# Patient Record
Sex: Female | Born: 1950
Health system: Southern US, Community
[De-identification: ages and names within clinical notes are randomized; demographics above are authoritative.]

---

## 1998-07-09 ENCOUNTER — Other Ambulatory Visit: Admission: RE | Admit: 1998-07-09 | Discharge: 1998-07-09 | Payer: Self-pay | Admitting: Obstetrics & Gynecology

## 1998-09-02 ENCOUNTER — Other Ambulatory Visit: Admission: RE | Admit: 1998-09-02 | Discharge: 1998-09-02 | Payer: Self-pay | Admitting: Obstetrics & Gynecology

## 1998-09-17 ENCOUNTER — Inpatient Hospital Stay (HOSPITAL_COMMUNITY): Admission: AD | Admit: 1998-09-17 | Discharge: 1998-09-17 | Payer: Self-pay | Admitting: *Deleted

## 1998-10-05 ENCOUNTER — Ambulatory Visit (HOSPITAL_COMMUNITY): Admission: RE | Admit: 1998-10-05 | Discharge: 1998-10-05 | Payer: Self-pay | Admitting: Obstetrics & Gynecology

## 1999-03-12 ENCOUNTER — Other Ambulatory Visit: Admission: RE | Admit: 1999-03-12 | Discharge: 1999-03-12 | Payer: Self-pay | Admitting: Obstetrics & Gynecology

## 1999-03-29 ENCOUNTER — Ambulatory Visit (HOSPITAL_COMMUNITY): Admission: RE | Admit: 1999-03-29 | Discharge: 1999-03-29 | Payer: Self-pay | Admitting: Interventional Radiology

## 1999-04-01 ENCOUNTER — Encounter: Payer: Self-pay | Admitting: Obstetrics & Gynecology

## 1999-06-24 ENCOUNTER — Ambulatory Visit (HOSPITAL_COMMUNITY): Admission: RE | Admit: 1999-06-24 | Discharge: 1999-06-24 | Payer: Self-pay | Admitting: Interventional Radiology

## 1999-07-05 ENCOUNTER — Encounter: Payer: Self-pay | Admitting: Interventional Radiology

## 1999-07-05 ENCOUNTER — Observation Stay (HOSPITAL_COMMUNITY): Admission: RE | Admit: 1999-07-05 | Discharge: 1999-07-06 | Payer: Self-pay | Admitting: Interventional Radiology

## 1999-07-11 ENCOUNTER — Other Ambulatory Visit: Admission: RE | Admit: 1999-07-11 | Discharge: 1999-07-11 | Payer: Self-pay | Admitting: *Deleted

## 1999-10-17 ENCOUNTER — Encounter: Payer: Self-pay | Admitting: Obstetrics & Gynecology

## 1999-10-17 ENCOUNTER — Ambulatory Visit (HOSPITAL_COMMUNITY): Admission: RE | Admit: 1999-10-17 | Discharge: 1999-10-17 | Payer: Self-pay | Admitting: Obstetrics & Gynecology

## 1999-12-21 ENCOUNTER — Ambulatory Visit (HOSPITAL_COMMUNITY): Admission: RE | Admit: 1999-12-21 | Discharge: 1999-12-21 | Payer: Self-pay | Admitting: Interventional Radiology

## 1999-12-22 ENCOUNTER — Ambulatory Visit (HOSPITAL_COMMUNITY): Admission: RE | Admit: 1999-12-22 | Discharge: 1999-12-22 | Payer: Self-pay | Admitting: Interventional Radiology

## 2000-02-06 ENCOUNTER — Other Ambulatory Visit: Admission: RE | Admit: 2000-02-06 | Discharge: 2000-02-06 | Payer: Self-pay | Admitting: Obstetrics & Gynecology

## 2000-02-06 ENCOUNTER — Encounter (INDEPENDENT_AMBULATORY_CARE_PROVIDER_SITE_OTHER): Payer: Self-pay

## 2000-06-06 ENCOUNTER — Encounter: Admission: RE | Admit: 2000-06-06 | Discharge: 2000-06-06 | Payer: Self-pay | Admitting: Family Medicine

## 2000-06-06 ENCOUNTER — Encounter: Payer: Self-pay | Admitting: Family Medicine

## 2000-10-01 ENCOUNTER — Encounter: Payer: Self-pay | Admitting: Neurology

## 2000-10-01 ENCOUNTER — Ambulatory Visit (HOSPITAL_COMMUNITY): Admission: RE | Admit: 2000-10-01 | Discharge: 2000-10-01 | Payer: Self-pay | Admitting: Neurology

## 2001-06-07 ENCOUNTER — Encounter: Admission: RE | Admit: 2001-06-07 | Discharge: 2001-06-07 | Payer: Self-pay | Admitting: Family Medicine

## 2001-06-07 ENCOUNTER — Encounter: Payer: Self-pay | Admitting: Family Medicine

## 2001-11-09 ENCOUNTER — Ambulatory Visit (HOSPITAL_COMMUNITY): Admission: RE | Admit: 2001-11-09 | Discharge: 2001-11-09 | Payer: Self-pay | Admitting: Family Medicine

## 2001-11-09 ENCOUNTER — Encounter: Payer: Self-pay | Admitting: Family Medicine

## 2002-05-26 ENCOUNTER — Encounter: Payer: Self-pay | Admitting: Neurosurgery

## 2002-05-26 ENCOUNTER — Ambulatory Visit (HOSPITAL_COMMUNITY): Admission: RE | Admit: 2002-05-26 | Discharge: 2002-05-26 | Payer: Self-pay | Admitting: Neurosurgery

## 2002-06-09 ENCOUNTER — Encounter: Admission: RE | Admit: 2002-06-09 | Discharge: 2002-06-09 | Payer: Self-pay | Admitting: Family Medicine

## 2002-06-09 ENCOUNTER — Encounter: Payer: Self-pay | Admitting: Family Medicine

## 2002-09-04 ENCOUNTER — Other Ambulatory Visit: Admission: RE | Admit: 2002-09-04 | Discharge: 2002-09-04 | Payer: Self-pay | Admitting: Family Medicine

## 2003-06-11 ENCOUNTER — Encounter: Admission: RE | Admit: 2003-06-11 | Discharge: 2003-06-11 | Payer: Self-pay | Admitting: Family Medicine

## 2003-06-11 ENCOUNTER — Encounter: Payer: Self-pay | Admitting: Family Medicine

## 2004-06-13 ENCOUNTER — Encounter: Admission: RE | Admit: 2004-06-13 | Discharge: 2004-06-13 | Payer: Self-pay | Admitting: Family Medicine

## 2004-10-12 ENCOUNTER — Other Ambulatory Visit: Admission: RE | Admit: 2004-10-12 | Discharge: 2004-10-12 | Payer: Self-pay | Admitting: Family Medicine

## 2005-06-15 ENCOUNTER — Encounter: Admission: RE | Admit: 2005-06-15 | Discharge: 2005-06-15 | Payer: Self-pay | Admitting: Family Medicine

## 2006-06-18 ENCOUNTER — Encounter: Admission: RE | Admit: 2006-06-18 | Discharge: 2006-06-18 | Payer: Self-pay | Admitting: Family Medicine

## 2006-10-18 ENCOUNTER — Other Ambulatory Visit: Admission: RE | Admit: 2006-10-18 | Discharge: 2006-10-18 | Payer: Self-pay | Admitting: Family Medicine

## 2007-06-20 ENCOUNTER — Encounter: Admission: RE | Admit: 2007-06-20 | Discharge: 2007-06-20 | Payer: Self-pay | Admitting: Family Medicine

## 2007-10-24 ENCOUNTER — Other Ambulatory Visit: Admission: RE | Admit: 2007-10-24 | Discharge: 2007-10-24 | Payer: Self-pay | Admitting: Family Medicine

## 2008-06-22 ENCOUNTER — Encounter: Admission: RE | Admit: 2008-06-22 | Discharge: 2008-06-22 | Payer: Self-pay | Admitting: Family Medicine

## 2009-06-23 ENCOUNTER — Encounter: Admission: RE | Admit: 2009-06-23 | Discharge: 2009-06-23 | Payer: Self-pay | Admitting: Family Medicine

## 2009-11-08 ENCOUNTER — Other Ambulatory Visit: Admission: RE | Admit: 2009-11-08 | Discharge: 2009-11-08 | Payer: Self-pay | Admitting: Family Medicine

## 2010-07-01 ENCOUNTER — Encounter: Admission: RE | Admit: 2010-07-01 | Discharge: 2010-07-01 | Payer: Self-pay | Admitting: Family Medicine

## 2010-11-10 ENCOUNTER — Other Ambulatory Visit: Admission: RE | Admit: 2010-11-10 | Discharge: 2010-11-10 | Payer: Self-pay | Admitting: Family Medicine

## 2011-02-16 ENCOUNTER — Other Ambulatory Visit: Payer: Self-pay | Admitting: Family Medicine

## 2011-02-16 DIAGNOSIS — Z1231 Encounter for screening mammogram for malignant neoplasm of breast: Secondary | ICD-10-CM

## 2011-07-04 ENCOUNTER — Ambulatory Visit
Admission: RE | Admit: 2011-07-04 | Discharge: 2011-07-04 | Disposition: A | Discharge status: Home / Self Care | Payer: 59 | Source: Ambulatory Visit | Attending: Family Medicine | Admitting: Family Medicine

## 2011-07-04 DIAGNOSIS — Z1231 Encounter for screening mammogram for malignant neoplasm of breast: Secondary | ICD-10-CM

## 2012-03-14 ENCOUNTER — Other Ambulatory Visit: Payer: Self-pay | Admitting: Family Medicine

## 2012-03-14 DIAGNOSIS — Z1231 Encounter for screening mammogram for malignant neoplasm of breast: Secondary | ICD-10-CM

## 2012-07-05 ENCOUNTER — Ambulatory Visit
Admission: RE | Admit: 2012-07-05 | Discharge: 2012-07-05 | Disposition: A | Discharge status: Home / Self Care | Payer: BC Managed Care – PPO | Source: Ambulatory Visit | Attending: Family Medicine | Admitting: Family Medicine

## 2012-07-05 DIAGNOSIS — Z1231 Encounter for screening mammogram for malignant neoplasm of breast: Secondary | ICD-10-CM

## 2013-02-21 ENCOUNTER — Other Ambulatory Visit: Payer: Self-pay

## 2013-02-21 DIAGNOSIS — Z1231 Encounter for screening mammogram for malignant neoplasm of breast: Secondary | ICD-10-CM

## 2013-07-08 ENCOUNTER — Ambulatory Visit
Admission: RE | Admit: 2013-07-08 | Discharge: 2013-07-08 | Disposition: A | Discharge status: Home / Self Care | Payer: BC Managed Care – PPO | Source: Ambulatory Visit

## 2013-07-08 DIAGNOSIS — Z1231 Encounter for screening mammogram for malignant neoplasm of breast: Secondary | ICD-10-CM

## 2013-08-11 ENCOUNTER — Other Ambulatory Visit: Payer: Self-pay | Admitting: Family Medicine

## 2013-08-11 ENCOUNTER — Ambulatory Visit
Admission: RE | Admit: 2013-08-11 | Discharge: 2013-08-11 | Disposition: A | Discharge status: Home / Self Care | Payer: 59 | Source: Ambulatory Visit | Attending: Family Medicine | Admitting: Family Medicine

## 2013-08-11 ENCOUNTER — Ambulatory Visit
Admission: RE | Admit: 2013-08-11 | Discharge: 2013-08-11 | Disposition: A | Discharge status: Home / Self Care | Payer: BC Managed Care – PPO | Source: Ambulatory Visit | Attending: Family Medicine | Admitting: Family Medicine

## 2013-08-11 DIAGNOSIS — R609 Edema, unspecified: Secondary | ICD-10-CM

## 2013-08-11 DIAGNOSIS — R52 Pain, unspecified: Secondary | ICD-10-CM

## 2013-08-14 ENCOUNTER — Other Ambulatory Visit (HOSPITAL_COMMUNITY): Payer: Self-pay | Admitting: Family Medicine

## 2013-08-14 DIAGNOSIS — M79641 Pain in right hand: Secondary | ICD-10-CM

## 2013-08-19 ENCOUNTER — Encounter (HOSPITAL_COMMUNITY)
Admission: RE | Admit: 2013-08-19 | Discharge: 2013-08-19 | Disposition: A | Discharge status: Home / Self Care | Payer: 59 | Source: Ambulatory Visit | Attending: Family Medicine | Admitting: Family Medicine

## 2013-08-19 DIAGNOSIS — M79641 Pain in right hand: Secondary | ICD-10-CM

## 2013-08-19 DIAGNOSIS — M79609 Pain in unspecified limb: Secondary | ICD-10-CM | POA: Insufficient documentation

## 2013-08-19 MED ORDER — TECHNETIUM TC 99M MEDRONATE IV KIT
25.0000 | PACK | Freq: Once | INTRAVENOUS | Status: AC | PRN
  Administered 2013-08-19: 25 via INTRAVENOUS

## 2013-12-03 ENCOUNTER — Other Ambulatory Visit: Payer: Self-pay | Admitting: Family Medicine

## 2013-12-03 ENCOUNTER — Other Ambulatory Visit (HOSPITAL_COMMUNITY)
Admission: RE | Admit: 2013-12-03 | Discharge: 2013-12-03 | Disposition: A | Discharge status: Home / Self Care | Payer: 59 | Source: Ambulatory Visit | Attending: Family Medicine | Admitting: Family Medicine

## 2013-12-03 DIAGNOSIS — Z124 Encounter for screening for malignant neoplasm of cervix: Secondary | ICD-10-CM | POA: Insufficient documentation

## 2014-03-16 ENCOUNTER — Other Ambulatory Visit: Payer: Self-pay

## 2014-03-16 DIAGNOSIS — Z1231 Encounter for screening mammogram for malignant neoplasm of breast: Secondary | ICD-10-CM

## 2014-07-10 ENCOUNTER — Ambulatory Visit
Admission: RE | Admit: 2014-07-10 | Discharge: 2014-07-10 | Disposition: A | Discharge status: Home / Self Care | Payer: 59 | Source: Ambulatory Visit

## 2014-07-10 DIAGNOSIS — Z1231 Encounter for screening mammogram for malignant neoplasm of breast: Secondary | ICD-10-CM

## 2015-02-10 IMAGING — CR DG FINGER LITTLE 2+V*R*
3 series · 3 of 3 positions shown · non-contrast
Comparison: None.

CLINICAL DATA: Pain and swelling [DATE] half weeks involving the
right fifth distal interphalangeal joint

RIGHT LITTLE FINGER 2+V

[view not recorded (1 of 3)]
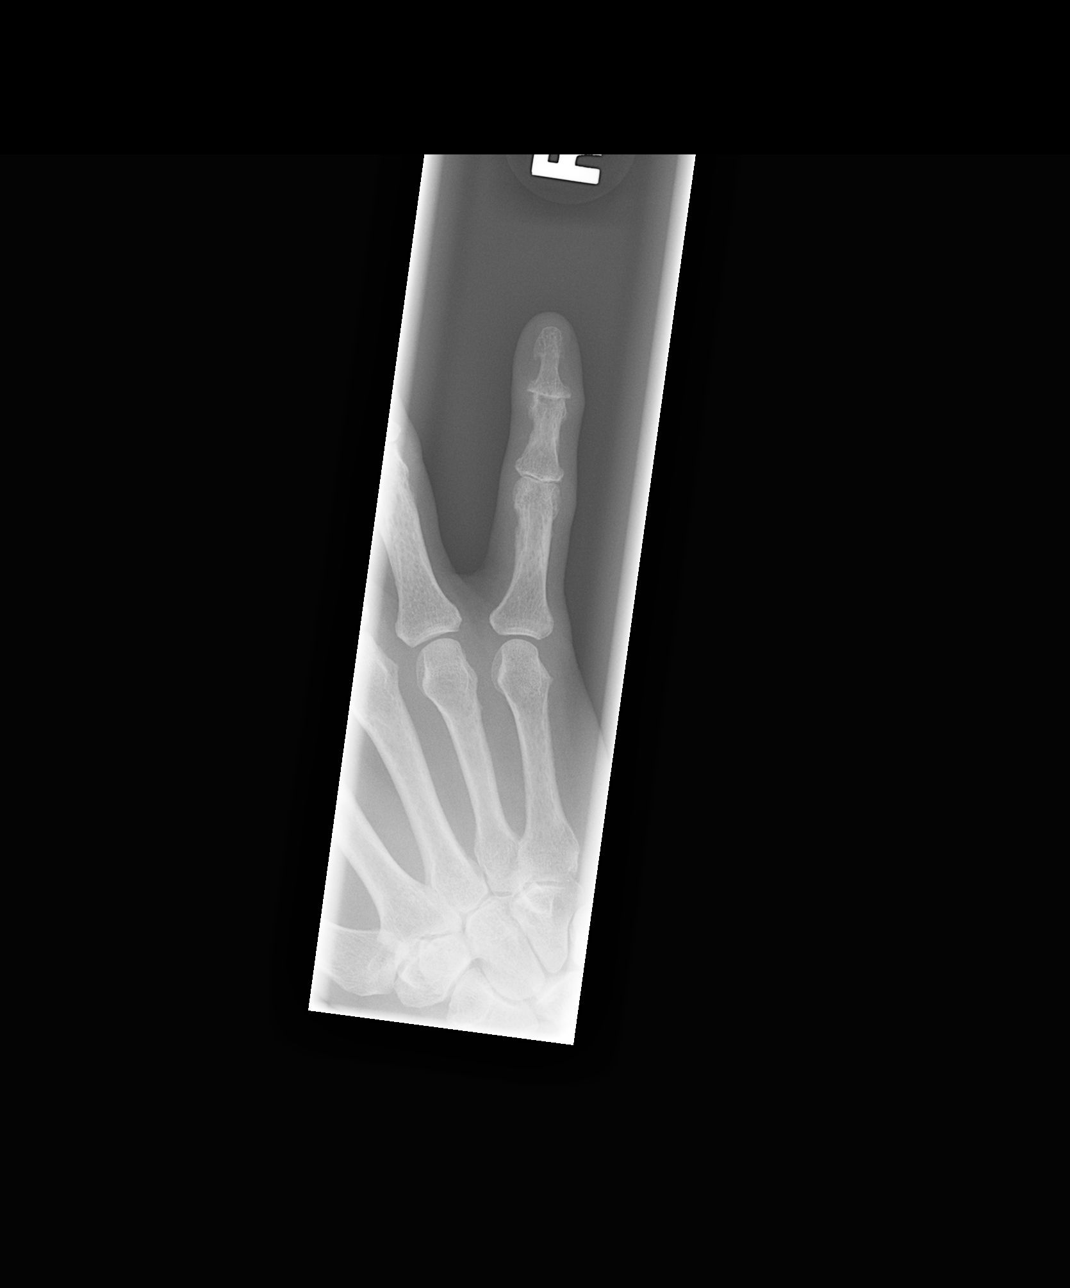

[view not recorded (2 of 3)]
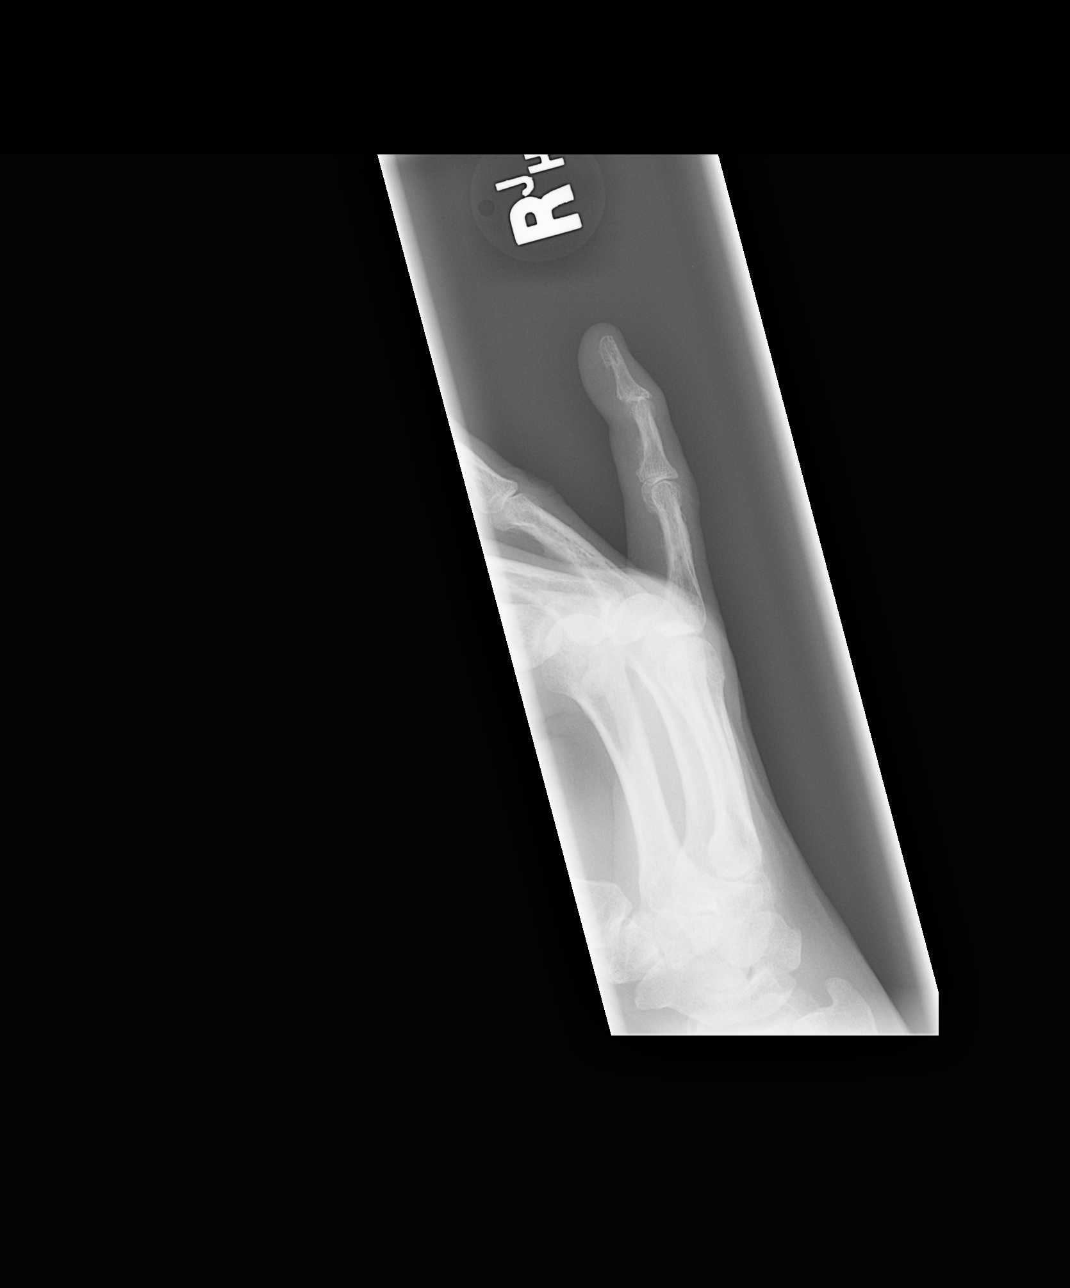

[view not recorded (3 of 3)]
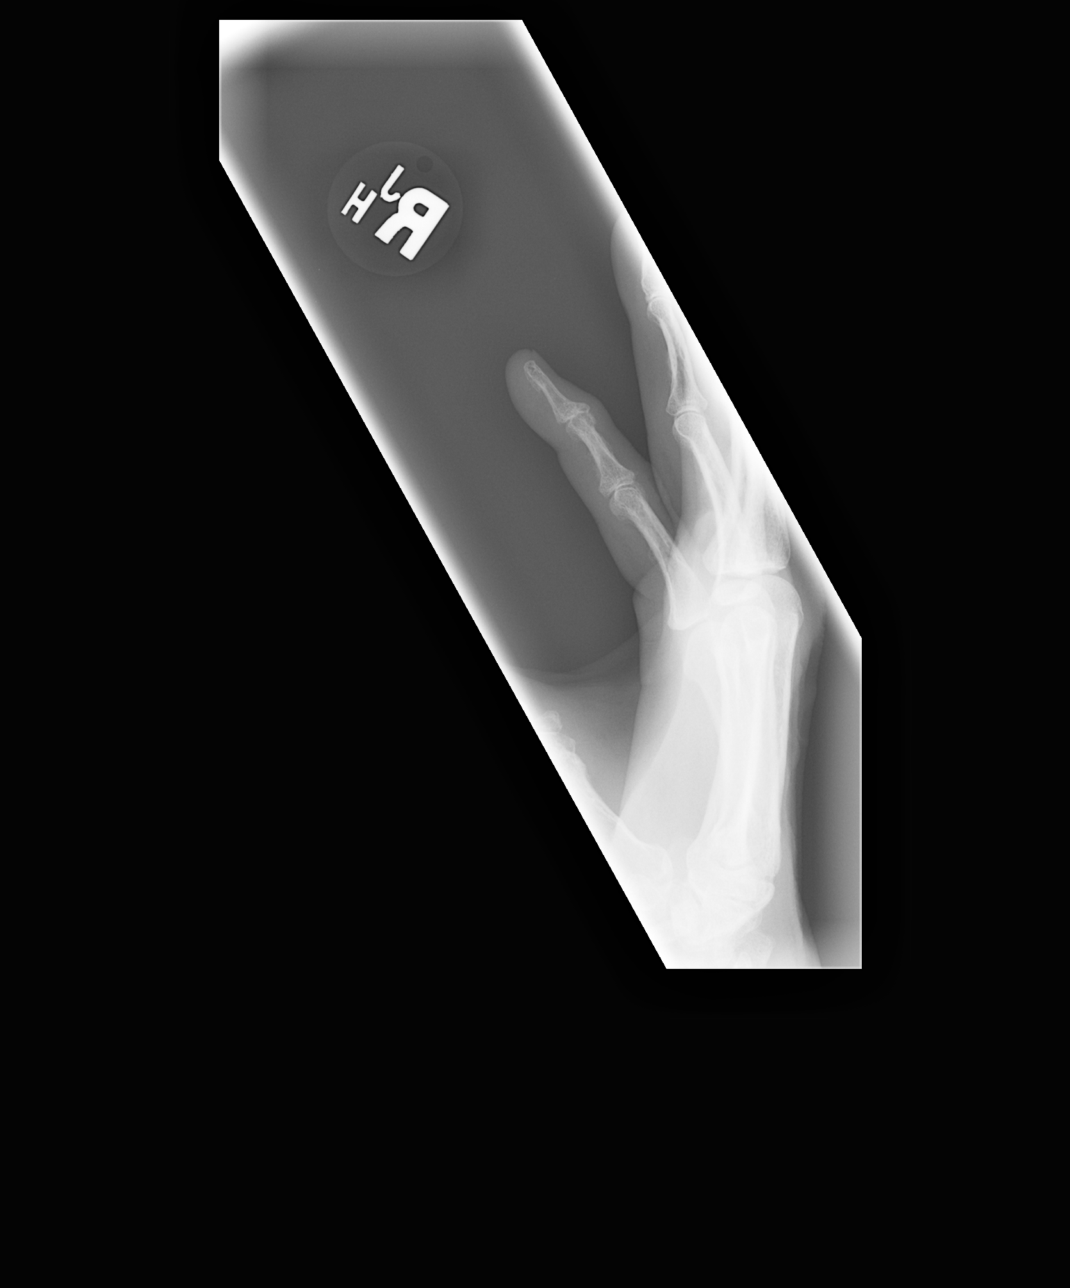

[3 of 3 positions shown; findings below may reference images not displayed]

FINDINGS: There is narrowing of the distal interphalangeal joint.
There is mild soft tissue swelling.  There is minimal osteophyte
formation.  There is some evidence of subcortical lucency involving
the distal aspect of the middle phalanx and the cortex of the
distal aspect of the middle phalanx it is difficult to visualize.
IMPRESSION: Distal interphalangeal joint is abnormal with associated soft
tissue swelling and significant joint space loss.  This could be
due to arthritis and could represent a Heberdens Node, alhtough
there is relatively little osteophyte formation.  If there is any
reason to suspect infection, a 3-phase bone scan would be
suggested.

## 2015-02-26 ENCOUNTER — Other Ambulatory Visit: Payer: Self-pay

## 2015-02-26 DIAGNOSIS — Z1231 Encounter for screening mammogram for malignant neoplasm of breast: Secondary | ICD-10-CM

## 2015-07-12 ENCOUNTER — Ambulatory Visit
Admission: RE | Admit: 2015-07-12 | Discharge: 2015-07-12 | Disposition: A | Discharge status: Home / Self Care | Payer: PPO | Source: Ambulatory Visit

## 2015-07-12 DIAGNOSIS — Z1231 Encounter for screening mammogram for malignant neoplasm of breast: Secondary | ICD-10-CM

## 2016-01-03 DIAGNOSIS — A35 Other tetanus: Secondary | ICD-10-CM | POA: Diagnosis not present

## 2016-01-03 DIAGNOSIS — M1990 Unspecified osteoarthritis, unspecified site: Secondary | ICD-10-CM | POA: Diagnosis not present

## 2016-01-03 DIAGNOSIS — E538 Deficiency of other specified B group vitamins: Secondary | ICD-10-CM | POA: Diagnosis not present

## 2016-01-03 DIAGNOSIS — I10 Essential (primary) hypertension: Secondary | ICD-10-CM | POA: Diagnosis not present

## 2016-01-03 DIAGNOSIS — F419 Anxiety disorder, unspecified: Secondary | ICD-10-CM | POA: Diagnosis not present

## 2016-01-03 DIAGNOSIS — Z0000 Encounter for general adult medical examination without abnormal findings: Secondary | ICD-10-CM | POA: Diagnosis not present

## 2016-01-03 DIAGNOSIS — G609 Hereditary and idiopathic neuropathy, unspecified: Secondary | ICD-10-CM | POA: Diagnosis not present

## 2016-04-11 DIAGNOSIS — H524 Presbyopia: Secondary | ICD-10-CM | POA: Diagnosis not present

## 2016-04-11 DIAGNOSIS — H2513 Age-related nuclear cataract, bilateral: Secondary | ICD-10-CM | POA: Diagnosis not present

## 2016-04-11 DIAGNOSIS — H5212 Myopia, left eye: Secondary | ICD-10-CM | POA: Diagnosis not present

## 2016-04-11 DIAGNOSIS — H04123 Dry eye syndrome of bilateral lacrimal glands: Secondary | ICD-10-CM | POA: Diagnosis not present

## 2016-04-11 DIAGNOSIS — H353131 Nonexudative age-related macular degeneration, bilateral, early dry stage: Secondary | ICD-10-CM | POA: Diagnosis not present

## 2016-04-11 DIAGNOSIS — H5201 Hypermetropia, right eye: Secondary | ICD-10-CM | POA: Diagnosis not present

## 2016-04-11 DIAGNOSIS — H52222 Regular astigmatism, left eye: Secondary | ICD-10-CM | POA: Diagnosis not present

## 2016-04-11 DIAGNOSIS — H52221 Regular astigmatism, right eye: Secondary | ICD-10-CM | POA: Diagnosis not present

## 2016-04-11 DIAGNOSIS — H40013 Open angle with borderline findings, low risk, bilateral: Secondary | ICD-10-CM | POA: Diagnosis not present

## 2016-04-20 ENCOUNTER — Ambulatory Visit (INDEPENDENT_AMBULATORY_CARE_PROVIDER_SITE_OTHER): Payer: PPO | Admitting: Podiatry

## 2016-04-20 ENCOUNTER — Ambulatory Visit: Payer: Self-pay

## 2016-04-20 VITALS — BP 136/86 | HR 74 | Temp 99.2°F | Resp 16

## 2016-04-20 DIAGNOSIS — L02612 Cutaneous abscess of left foot: Secondary | ICD-10-CM | POA: Diagnosis not present

## 2016-04-20 DIAGNOSIS — L03032 Cellulitis of left toe: Secondary | ICD-10-CM | POA: Diagnosis not present

## 2016-04-20 DIAGNOSIS — M79675 Pain in left toe(s): Secondary | ICD-10-CM | POA: Diagnosis not present

## 2016-04-20 DIAGNOSIS — M79671 Pain in right foot: Secondary | ICD-10-CM | POA: Diagnosis not present

## 2016-04-20 MED ORDER — AMOXICILLIN-POT CLAVULANATE 875-125 MG PO TABS
1.0000 | ORAL_TABLET | Freq: Two times a day (BID) | ORAL | Status: DC
Start: 2016-04-20 — End: 2016-04-28

## 2016-04-20 NOTE — Progress Notes (Signed)
   Subjective:    Patient ID: Tara [address], female    DOB: 09/29/1951, 65 y.o.   MRN: [id number]  HPI    Review of Systems  HENT: Positive for hearing loss.   Cardiovascular: Positive for leg swelling.       Objective:   Physical Exam        Assessment & Plan:

## 2016-04-23 NOTE — Progress Notes (Signed)
Subjective:     Patient ID: Tara [place], female   DOB: 09/09/1951, 65 y.o.   MRN: [id number]  HPI patient presents with discoloration distal third digit left where she's had long-term neuropathy and has no history of diabetes. There is some draining tissue on this area it is localized with mild redness of the toe itself but no proximal edema erythema drainage noted   Review of Systems  All other systems reviewed and are negative.      Objective:   Physical Exam  Constitutional: She is oriented to person, place, and time.  Cardiovascular: Intact distal pulses.   Musculoskeletal: Normal range of motion.  Neurological: She is oriented to person, place, and time.  Skin: Skin is warm.  Nursing note and vitals reviewed.  neurovascular status intact muscle strength was at range of motion within normal limits with diminished sharp Dole vibratory noted. Patient's found to have a distal keratotic lesion third digit left with some localized drainage that is an indication of an effective process. There is no proximal edema in the drainage noted be on the interphalangeal joint of the hallux and there is no systemic temperature or other indications of systemic infection     Assessment:     Traumatized left third toe with neuropathy as complicating factor with possibilities for osteomyelitis    Plan:     H&P and x-rays reviewed. Today I debrided the tissue flushed and applied sterile dressing placed on oral antibiotics and instructed on reduced activity and dispensed a surgical shoe and a buttress pad to lift the toe. Gave her strict instructions to watch toe that should turn any further red that she is to go to the emergency room and also the fact I did explain to her it's possible amputation will be necessary if there is bone infection  Report inconclusive for distal portion third digit left with suspicious signs that there may be some localized bone infection but we will have to see the response  to antibiotics and treatment of the area locally

## 2016-04-25 ENCOUNTER — Other Ambulatory Visit: Payer: Self-pay

## 2016-04-25 DIAGNOSIS — Z1231 Encounter for screening mammogram for malignant neoplasm of breast: Secondary | ICD-10-CM

## 2016-04-27 ENCOUNTER — Ambulatory Visit (INDEPENDENT_AMBULATORY_CARE_PROVIDER_SITE_OTHER): Payer: PPO | Admitting: Podiatry

## 2016-04-27 DIAGNOSIS — L02612 Cutaneous abscess of left foot: Secondary | ICD-10-CM

## 2016-04-27 DIAGNOSIS — L03032 Cellulitis of left toe: Secondary | ICD-10-CM

## 2016-04-27 DIAGNOSIS — M79675 Pain in left toe(s): Secondary | ICD-10-CM

## 2016-04-28 ENCOUNTER — Other Ambulatory Visit: Payer: Self-pay | Admitting: Podiatry

## 2016-04-28 NOTE — Telephone Encounter (Addendum)
Pt states she was [facility] [address]  Dr. Charlsie Merlesegal states refill Augmentin as previously. Done and informed pt.

## 2016-04-28 NOTE — Progress Notes (Signed)
Subjective:     Patient ID: Tara [address], female   DOB: 03/08/1951, 65 y.o.   MRN: [id number]  HPI patient states my toe is feeling quite a bit better on my left foot. Patient points the third toe   Review of Systems     Objective:   Physical Exam Neurovascular status intact negative Homans sign noted with improvement in the digit distal third digit left with crusted tissue and no active drainage noted. Minimal redness is noted in the toe currently and she is walking on it better with minimal discomfort    Assessment:     Probability for improvement in the third digit left which may still reoccur over time and I did explain that there is bone infection the chances for Amputation can still be present    Plan:     Continue with elevation of the toe debridement of tissue and soaks. If symptoms were to progress then work and the need to consider more aggressive treatment plan and she is encouraged to come in if any redness were to occur or any drainage or any other systemic signs of infection. If any systemic signs are to occur she is also to go straight to the emergency room

## 2016-07-03 DIAGNOSIS — I10 Essential (primary) hypertension: Secondary | ICD-10-CM | POA: Diagnosis not present

## 2016-07-03 DIAGNOSIS — M25532 Pain in left wrist: Secondary | ICD-10-CM | POA: Diagnosis not present

## 2016-07-03 DIAGNOSIS — F419 Anxiety disorder, unspecified: Secondary | ICD-10-CM | POA: Diagnosis not present

## 2016-07-03 DIAGNOSIS — M1990 Unspecified osteoarthritis, unspecified site: Secondary | ICD-10-CM | POA: Diagnosis not present

## 2016-07-03 DIAGNOSIS — Z23 Encounter for immunization: Secondary | ICD-10-CM | POA: Diagnosis not present

## 2016-07-03 DIAGNOSIS — G609 Hereditary and idiopathic neuropathy, unspecified: Secondary | ICD-10-CM | POA: Diagnosis not present

## 2016-07-03 DIAGNOSIS — E538 Deficiency of other specified B group vitamins: Secondary | ICD-10-CM | POA: Diagnosis not present

## 2016-07-11 DIAGNOSIS — H04123 Dry eye syndrome of bilateral lacrimal glands: Secondary | ICD-10-CM | POA: Diagnosis not present

## 2016-07-11 DIAGNOSIS — H40013 Open angle with borderline findings, low risk, bilateral: Secondary | ICD-10-CM | POA: Diagnosis not present

## 2016-07-11 DIAGNOSIS — H2513 Age-related nuclear cataract, bilateral: Secondary | ICD-10-CM | POA: Diagnosis not present

## 2016-07-11 DIAGNOSIS — H353131 Nonexudative age-related macular degeneration, bilateral, early dry stage: Secondary | ICD-10-CM | POA: Diagnosis not present

## 2016-07-13 ENCOUNTER — Ambulatory Visit: Payer: PPO

## 2016-08-02 DIAGNOSIS — M654 Radial styloid tenosynovitis [de Quervain]: Secondary | ICD-10-CM | POA: Diagnosis not present

## 2016-08-14 DIAGNOSIS — Z960 Presence of urogenital implants: Secondary | ICD-10-CM | POA: Diagnosis not present

## 2016-08-14 DIAGNOSIS — Z1231 Encounter for screening mammogram for malignant neoplasm of breast: Secondary | ICD-10-CM | POA: Diagnosis not present

## 2016-09-06 DIAGNOSIS — M654 Radial styloid tenosynovitis [de Quervain]: Secondary | ICD-10-CM | POA: Diagnosis not present

## 2016-10-04 DIAGNOSIS — M654 Radial styloid tenosynovitis [de Quervain]: Secondary | ICD-10-CM | POA: Diagnosis not present

## 2016-11-14 DIAGNOSIS — H04123 Dry eye syndrome of bilateral lacrimal glands: Secondary | ICD-10-CM | POA: Diagnosis not present

## 2016-11-14 DIAGNOSIS — H353131 Nonexudative age-related macular degeneration, bilateral, early dry stage: Secondary | ICD-10-CM | POA: Diagnosis not present

## 2016-11-14 DIAGNOSIS — H2513 Age-related nuclear cataract, bilateral: Secondary | ICD-10-CM | POA: Diagnosis not present

## 2016-11-14 DIAGNOSIS — H40013 Open angle with borderline findings, low risk, bilateral: Secondary | ICD-10-CM | POA: Diagnosis not present

## 2016-12-12 DIAGNOSIS — K640 First degree hemorrhoids: Secondary | ICD-10-CM | POA: Diagnosis not present

## 2016-12-12 DIAGNOSIS — Z1211 Encounter for screening for malignant neoplasm of colon: Secondary | ICD-10-CM | POA: Diagnosis not present

## 2016-12-12 DIAGNOSIS — K5730 Diverticulosis of large intestine without perforation or abscess without bleeding: Secondary | ICD-10-CM | POA: Diagnosis not present

## 2016-12-14 DIAGNOSIS — Z6830 Body mass index (BMI) 30.0-30.9, adult: Secondary | ICD-10-CM | POA: Diagnosis not present

## 2016-12-14 DIAGNOSIS — N952 Postmenopausal atrophic vaginitis: Secondary | ICD-10-CM | POA: Diagnosis not present

## 2016-12-14 DIAGNOSIS — M859 Disorder of bone density and structure, unspecified: Secondary | ICD-10-CM | POA: Diagnosis not present

## 2016-12-14 DIAGNOSIS — Z01419 Encounter for gynecological examination (general) (routine) without abnormal findings: Secondary | ICD-10-CM | POA: Diagnosis not present

## 2016-12-14 DIAGNOSIS — Z124 Encounter for screening for malignant neoplasm of cervix: Secondary | ICD-10-CM | POA: Diagnosis not present

## 2017-01-04 DIAGNOSIS — M1990 Unspecified osteoarthritis, unspecified site: Secondary | ICD-10-CM | POA: Diagnosis not present

## 2017-01-04 DIAGNOSIS — G609 Hereditary and idiopathic neuropathy, unspecified: Secondary | ICD-10-CM | POA: Diagnosis not present

## 2017-01-04 DIAGNOSIS — F419 Anxiety disorder, unspecified: Secondary | ICD-10-CM | POA: Diagnosis not present

## 2017-01-04 DIAGNOSIS — E538 Deficiency of other specified B group vitamins: Secondary | ICD-10-CM | POA: Diagnosis not present

## 2017-01-04 DIAGNOSIS — Z0000 Encounter for general adult medical examination without abnormal findings: Secondary | ICD-10-CM | POA: Diagnosis not present

## 2017-01-04 DIAGNOSIS — I10 Essential (primary) hypertension: Secondary | ICD-10-CM | POA: Diagnosis not present

## 2017-05-15 DIAGNOSIS — H524 Presbyopia: Secondary | ICD-10-CM | POA: Diagnosis not present

## 2017-05-15 DIAGNOSIS — H25813 Combined forms of age-related cataract, bilateral: Secondary | ICD-10-CM | POA: Diagnosis not present

## 2017-05-15 DIAGNOSIS — H52223 Regular astigmatism, bilateral: Secondary | ICD-10-CM | POA: Diagnosis not present

## 2017-05-15 DIAGNOSIS — H04123 Dry eye syndrome of bilateral lacrimal glands: Secondary | ICD-10-CM | POA: Diagnosis not present

## 2017-05-15 DIAGNOSIS — H40023 Open angle with borderline findings, high risk, bilateral: Secondary | ICD-10-CM | POA: Diagnosis not present

## 2017-05-15 DIAGNOSIS — H353131 Nonexudative age-related macular degeneration, bilateral, early dry stage: Secondary | ICD-10-CM | POA: Diagnosis not present

## 2017-07-05 DIAGNOSIS — I10 Essential (primary) hypertension: Secondary | ICD-10-CM | POA: Diagnosis not present

## 2017-07-05 DIAGNOSIS — E669 Obesity, unspecified: Secondary | ICD-10-CM | POA: Diagnosis not present

## 2017-07-05 DIAGNOSIS — F419 Anxiety disorder, unspecified: Secondary | ICD-10-CM | POA: Diagnosis not present

## 2017-07-05 DIAGNOSIS — G609 Hereditary and idiopathic neuropathy, unspecified: Secondary | ICD-10-CM | POA: Diagnosis not present

## 2017-12-07 DIAGNOSIS — H40023 Open angle with borderline findings, high risk, bilateral: Secondary | ICD-10-CM | POA: Diagnosis not present

## 2017-12-07 DIAGNOSIS — H04123 Dry eye syndrome of bilateral lacrimal glands: Secondary | ICD-10-CM | POA: Diagnosis not present

## 2017-12-07 DIAGNOSIS — H353131 Nonexudative age-related macular degeneration, bilateral, early dry stage: Secondary | ICD-10-CM | POA: Diagnosis not present

## 2017-12-07 DIAGNOSIS — H25813 Combined forms of age-related cataract, bilateral: Secondary | ICD-10-CM | POA: Diagnosis not present

## 2017-12-19 DIAGNOSIS — Z6830 Body mass index (BMI) 30.0-30.9, adult: Secondary | ICD-10-CM | POA: Diagnosis not present

## 2017-12-19 DIAGNOSIS — Z1231 Encounter for screening mammogram for malignant neoplasm of breast: Secondary | ICD-10-CM | POA: Diagnosis not present

## 2017-12-19 DIAGNOSIS — N8110 Cystocele, unspecified: Secondary | ICD-10-CM | POA: Diagnosis not present

## 2017-12-19 DIAGNOSIS — N952 Postmenopausal atrophic vaginitis: Secondary | ICD-10-CM | POA: Diagnosis not present

## 2017-12-19 DIAGNOSIS — M859 Disorder of bone density and structure, unspecified: Secondary | ICD-10-CM | POA: Diagnosis not present

## 2017-12-19 DIAGNOSIS — Z01419 Encounter for gynecological examination (general) (routine) without abnormal findings: Secondary | ICD-10-CM | POA: Diagnosis not present

## 2017-12-19 DIAGNOSIS — R6882 Decreased libido: Secondary | ICD-10-CM | POA: Diagnosis not present

## 2018-01-14 DIAGNOSIS — I10 Essential (primary) hypertension: Secondary | ICD-10-CM | POA: Diagnosis not present

## 2018-01-14 DIAGNOSIS — Z0000 Encounter for general adult medical examination without abnormal findings: Secondary | ICD-10-CM | POA: Diagnosis not present

## 2018-01-14 DIAGNOSIS — E669 Obesity, unspecified: Secondary | ICD-10-CM | POA: Diagnosis not present

## 2018-01-14 DIAGNOSIS — F419 Anxiety disorder, unspecified: Secondary | ICD-10-CM | POA: Diagnosis not present

## 2018-01-14 DIAGNOSIS — Z6830 Body mass index (BMI) 30.0-30.9, adult: Secondary | ICD-10-CM | POA: Diagnosis not present

## 2018-01-14 DIAGNOSIS — G609 Hereditary and idiopathic neuropathy, unspecified: Secondary | ICD-10-CM | POA: Diagnosis not present

## 2018-01-14 DIAGNOSIS — M1990 Unspecified osteoarthritis, unspecified site: Secondary | ICD-10-CM | POA: Diagnosis not present

## 2018-01-14 DIAGNOSIS — Z1159 Encounter for screening for other viral diseases: Secondary | ICD-10-CM | POA: Diagnosis not present

## 2018-01-14 DIAGNOSIS — E538 Deficiency of other specified B group vitamins: Secondary | ICD-10-CM | POA: Diagnosis not present

## 2018-05-31 DIAGNOSIS — R509 Fever, unspecified: Secondary | ICD-10-CM | POA: Diagnosis not present

## 2018-06-11 DIAGNOSIS — H25813 Combined forms of age-related cataract, bilateral: Secondary | ICD-10-CM | POA: Diagnosis not present

## 2018-06-11 DIAGNOSIS — H40023 Open angle with borderline findings, high risk, bilateral: Secondary | ICD-10-CM | POA: Diagnosis not present

## 2018-06-11 DIAGNOSIS — H353131 Nonexudative age-related macular degeneration, bilateral, early dry stage: Secondary | ICD-10-CM | POA: Diagnosis not present

## 2018-06-11 DIAGNOSIS — H04123 Dry eye syndrome of bilateral lacrimal glands: Secondary | ICD-10-CM | POA: Diagnosis not present

## 2018-07-03 DIAGNOSIS — G609 Hereditary and idiopathic neuropathy, unspecified: Secondary | ICD-10-CM | POA: Diagnosis not present

## 2018-07-03 DIAGNOSIS — E538 Deficiency of other specified B group vitamins: Secondary | ICD-10-CM | POA: Diagnosis not present

## 2018-07-03 DIAGNOSIS — I10 Essential (primary) hypertension: Secondary | ICD-10-CM | POA: Diagnosis not present

## 2018-07-03 DIAGNOSIS — Z6829 Body mass index (BMI) 29.0-29.9, adult: Secondary | ICD-10-CM | POA: Diagnosis not present

## 2018-07-03 DIAGNOSIS — F419 Anxiety disorder, unspecified: Secondary | ICD-10-CM | POA: Diagnosis not present

## 2018-12-13 DIAGNOSIS — H25813 Combined forms of age-related cataract, bilateral: Secondary | ICD-10-CM | POA: Diagnosis not present

## 2018-12-13 DIAGNOSIS — H04123 Dry eye syndrome of bilateral lacrimal glands: Secondary | ICD-10-CM | POA: Diagnosis not present

## 2018-12-13 DIAGNOSIS — H353131 Nonexudative age-related macular degeneration, bilateral, early dry stage: Secondary | ICD-10-CM | POA: Diagnosis not present

## 2018-12-13 DIAGNOSIS — H40023 Open angle with borderline findings, high risk, bilateral: Secondary | ICD-10-CM | POA: Diagnosis not present

## 2018-12-20 DIAGNOSIS — Z1231 Encounter for screening mammogram for malignant neoplasm of breast: Secondary | ICD-10-CM | POA: Diagnosis not present

## 2018-12-20 DIAGNOSIS — Z01419 Encounter for gynecological examination (general) (routine) without abnormal findings: Secondary | ICD-10-CM | POA: Diagnosis not present

## 2018-12-20 DIAGNOSIS — Z13820 Encounter for screening for osteoporosis: Secondary | ICD-10-CM | POA: Diagnosis not present

## 2018-12-20 DIAGNOSIS — Z6830 Body mass index (BMI) 30.0-30.9, adult: Secondary | ICD-10-CM | POA: Diagnosis not present

## 2018-12-20 DIAGNOSIS — Z1211 Encounter for screening for malignant neoplasm of colon: Secondary | ICD-10-CM | POA: Diagnosis not present

## 2019-01-16 DIAGNOSIS — F329 Major depressive disorder, single episode, unspecified: Secondary | ICD-10-CM | POA: Diagnosis not present

## 2019-01-16 DIAGNOSIS — Z131 Encounter for screening for diabetes mellitus: Secondary | ICD-10-CM | POA: Diagnosis not present

## 2019-01-16 DIAGNOSIS — G609 Hereditary and idiopathic neuropathy, unspecified: Secondary | ICD-10-CM | POA: Diagnosis not present

## 2019-01-16 DIAGNOSIS — F419 Anxiety disorder, unspecified: Secondary | ICD-10-CM | POA: Diagnosis not present

## 2019-01-16 DIAGNOSIS — E785 Hyperlipidemia, unspecified: Secondary | ICD-10-CM | POA: Diagnosis not present

## 2019-01-16 DIAGNOSIS — Z0000 Encounter for general adult medical examination without abnormal findings: Secondary | ICD-10-CM | POA: Diagnosis not present

## 2019-01-16 DIAGNOSIS — E538 Deficiency of other specified B group vitamins: Secondary | ICD-10-CM | POA: Diagnosis not present

## 2019-01-16 DIAGNOSIS — I10 Essential (primary) hypertension: Secondary | ICD-10-CM | POA: Diagnosis not present

## 2019-06-17 DIAGNOSIS — H40023 Open angle with borderline findings, high risk, bilateral: Secondary | ICD-10-CM | POA: Diagnosis not present

## 2019-06-17 DIAGNOSIS — H25813 Combined forms of age-related cataract, bilateral: Secondary | ICD-10-CM | POA: Diagnosis not present

## 2019-06-17 DIAGNOSIS — H353131 Nonexudative age-related macular degeneration, bilateral, early dry stage: Secondary | ICD-10-CM | POA: Diagnosis not present

## 2019-06-17 DIAGNOSIS — H04123 Dry eye syndrome of bilateral lacrimal glands: Secondary | ICD-10-CM | POA: Diagnosis not present

## 2019-07-25 DIAGNOSIS — G609 Hereditary and idiopathic neuropathy, unspecified: Secondary | ICD-10-CM | POA: Diagnosis not present

## 2019-07-25 DIAGNOSIS — I10 Essential (primary) hypertension: Secondary | ICD-10-CM | POA: Diagnosis not present

## 2019-07-25 DIAGNOSIS — E785 Hyperlipidemia, unspecified: Secondary | ICD-10-CM | POA: Diagnosis not present

## 2019-07-25 DIAGNOSIS — F419 Anxiety disorder, unspecified: Secondary | ICD-10-CM | POA: Diagnosis not present

## 2019-07-25 DIAGNOSIS — F330 Major depressive disorder, recurrent, mild: Secondary | ICD-10-CM | POA: Diagnosis not present

## 2019-11-25 DIAGNOSIS — H2511 Age-related nuclear cataract, right eye: Secondary | ICD-10-CM | POA: Diagnosis not present

## 2019-11-25 DIAGNOSIS — H40023 Open angle with borderline findings, high risk, bilateral: Secondary | ICD-10-CM | POA: Diagnosis not present

## 2019-11-25 DIAGNOSIS — H25013 Cortical age-related cataract, bilateral: Secondary | ICD-10-CM | POA: Diagnosis not present

## 2019-11-25 DIAGNOSIS — H2513 Age-related nuclear cataract, bilateral: Secondary | ICD-10-CM | POA: Diagnosis not present

## 2019-12-01 DIAGNOSIS — H40023 Open angle with borderline findings, high risk, bilateral: Secondary | ICD-10-CM | POA: Diagnosis not present

## 2019-12-01 DIAGNOSIS — H04123 Dry eye syndrome of bilateral lacrimal glands: Secondary | ICD-10-CM | POA: Diagnosis not present

## 2019-12-01 DIAGNOSIS — H2513 Age-related nuclear cataract, bilateral: Secondary | ICD-10-CM | POA: Diagnosis not present

## 2019-12-01 DIAGNOSIS — H25013 Cortical age-related cataract, bilateral: Secondary | ICD-10-CM | POA: Diagnosis not present

## 2019-12-10 DIAGNOSIS — H2511 Age-related nuclear cataract, right eye: Secondary | ICD-10-CM | POA: Diagnosis not present

## 2019-12-10 DIAGNOSIS — H25811 Combined forms of age-related cataract, right eye: Secondary | ICD-10-CM | POA: Diagnosis not present

## 2019-12-11 DIAGNOSIS — H25012 Cortical age-related cataract, left eye: Secondary | ICD-10-CM | POA: Diagnosis not present

## 2019-12-11 DIAGNOSIS — H2512 Age-related nuclear cataract, left eye: Secondary | ICD-10-CM | POA: Diagnosis not present

## 2019-12-23 DIAGNOSIS — Z1231 Encounter for screening mammogram for malignant neoplasm of breast: Secondary | ICD-10-CM | POA: Diagnosis not present

## 2019-12-23 DIAGNOSIS — M859 Disorder of bone density and structure, unspecified: Secondary | ICD-10-CM | POA: Diagnosis not present

## 2019-12-24 DIAGNOSIS — H2512 Age-related nuclear cataract, left eye: Secondary | ICD-10-CM | POA: Diagnosis not present

## 2019-12-24 DIAGNOSIS — H25812 Combined forms of age-related cataract, left eye: Secondary | ICD-10-CM | POA: Diagnosis not present

## 2019-12-24 DIAGNOSIS — H25012 Cortical age-related cataract, left eye: Secondary | ICD-10-CM | POA: Diagnosis not present

## 2020-01-05 DIAGNOSIS — Z01419 Encounter for gynecological examination (general) (routine) without abnormal findings: Secondary | ICD-10-CM | POA: Diagnosis not present

## 2020-01-05 DIAGNOSIS — Z6831 Body mass index (BMI) 31.0-31.9, adult: Secondary | ICD-10-CM | POA: Diagnosis not present

## 2020-01-05 DIAGNOSIS — Z124 Encounter for screening for malignant neoplasm of cervix: Secondary | ICD-10-CM | POA: Diagnosis not present

## 2020-01-05 DIAGNOSIS — M859 Disorder of bone density and structure, unspecified: Secondary | ICD-10-CM | POA: Diagnosis not present

## 2020-01-05 DIAGNOSIS — Z1211 Encounter for screening for malignant neoplasm of colon: Secondary | ICD-10-CM | POA: Diagnosis not present

## 2020-01-05 DIAGNOSIS — E559 Vitamin D deficiency, unspecified: Secondary | ICD-10-CM | POA: Diagnosis not present

## 2020-01-05 DIAGNOSIS — Z1231 Encounter for screening mammogram for malignant neoplasm of breast: Secondary | ICD-10-CM | POA: Diagnosis not present

## 2020-01-20 DIAGNOSIS — I10 Essential (primary) hypertension: Secondary | ICD-10-CM | POA: Diagnosis not present

## 2020-01-20 DIAGNOSIS — E785 Hyperlipidemia, unspecified: Secondary | ICD-10-CM | POA: Diagnosis not present

## 2020-01-20 DIAGNOSIS — E669 Obesity, unspecified: Secondary | ICD-10-CM | POA: Diagnosis not present

## 2020-01-20 DIAGNOSIS — F330 Major depressive disorder, recurrent, mild: Secondary | ICD-10-CM | POA: Diagnosis not present

## 2020-01-20 DIAGNOSIS — G609 Hereditary and idiopathic neuropathy, unspecified: Secondary | ICD-10-CM | POA: Diagnosis not present

## 2020-01-20 DIAGNOSIS — Z0000 Encounter for general adult medical examination without abnormal findings: Secondary | ICD-10-CM | POA: Diagnosis not present

## 2020-01-20 DIAGNOSIS — N1831 Chronic kidney disease, stage 3a: Secondary | ICD-10-CM | POA: Diagnosis not present

## 2020-02-08 ENCOUNTER — Ambulatory Visit: Payer: PPO | Attending: Internal Medicine

## 2020-02-08 DIAGNOSIS — Z23 Encounter for immunization: Secondary | ICD-10-CM | POA: Insufficient documentation

## 2020-02-08 NOTE — Progress Notes (Signed)
   Covid-19 Vaccination Clinic  Name:  ARIELIS LEONHART    MRN: [id number] DOB: July 22, 1951  02/08/2020  Ms. Bents was observed post Covid-19 immunization for 15 minutes without incidence. She was provided with Vaccine Information Sheet and instruction to access the V-Safe system.   Ms. Lampley was instructed to call 911 with any severe reactions post vaccine: Marland Kitchen Difficulty breathing  . Swelling of your face and throat  . A fast heartbeat  . A bad rash all over your body  . Dizziness and weakness    Immunizations Administered    Name Date Dose VIS Date Route   Pfizer COVID-19 Vaccine 02/08/2020 11:51 AM 0.3 mL 12/05/2019 Intramuscular   Manufacturer: [employer], [employer]   Lot: [id number]   NDC: [address]-1

## 2020-03-02 ENCOUNTER — Ambulatory Visit: Payer: PPO | Attending: Internal Medicine

## 2020-03-02 DIAGNOSIS — Z23 Encounter for immunization: Secondary | ICD-10-CM

## 2020-03-02 NOTE — Progress Notes (Signed)
   Covid-19 Vaccination Clinic  Name:  Tara [address]    MRN: [id number] DOB: [birth year]  03/02/2020  [name] was observed post Covid-19 immunization for 15 minutes without incident. She was provided with Vaccine Information Sheet and instruction to access the V-Safe system.   [name] was instructed to call 911 with any severe reactions post vaccine: Marland Kitchen Difficulty breathing  . Swelling of face and throat  . A fast heartbeat  . A bad rash all over body  . Dizziness and weakness   Immunizations Administered    Name Date Dose VIS Date Route   Pfizer COVID-19 Vaccine 03/02/2020  3:47 PM 0.3 mL 12/05/2019 Intramuscular   Manufacturer: [employer], [employer]   Lot: [id number]   NDC: [address]-5

## 2020-04-20 DIAGNOSIS — H40023 Open angle with borderline findings, high risk, bilateral: Secondary | ICD-10-CM | POA: Diagnosis not present

## 2020-04-20 DIAGNOSIS — H04123 Dry eye syndrome of bilateral lacrimal glands: Secondary | ICD-10-CM | POA: Diagnosis not present

## 2020-05-13 DIAGNOSIS — I10 Essential (primary) hypertension: Secondary | ICD-10-CM | POA: Diagnosis not present

## 2020-05-13 DIAGNOSIS — E785 Hyperlipidemia, unspecified: Secondary | ICD-10-CM | POA: Diagnosis not present

## 2020-05-13 DIAGNOSIS — G609 Hereditary and idiopathic neuropathy, unspecified: Secondary | ICD-10-CM | POA: Diagnosis not present

## 2020-05-13 DIAGNOSIS — F330 Major depressive disorder, recurrent, mild: Secondary | ICD-10-CM | POA: Diagnosis not present

## 2020-07-14 ENCOUNTER — Ambulatory Visit: Payer: PPO | Admitting: Podiatry

## 2020-07-14 ENCOUNTER — Encounter: Payer: Self-pay | Admitting: Podiatry

## 2020-07-14 ENCOUNTER — Ambulatory Visit (INDEPENDENT_AMBULATORY_CARE_PROVIDER_SITE_OTHER): Payer: PPO

## 2020-07-14 ENCOUNTER — Other Ambulatory Visit: Payer: Self-pay | Admitting: Podiatry

## 2020-07-14 ENCOUNTER — Other Ambulatory Visit: Payer: Self-pay

## 2020-07-14 VITALS — Temp 98.9°F

## 2020-07-14 DIAGNOSIS — L02416 Cutaneous abscess of left lower limb: Secondary | ICD-10-CM | POA: Diagnosis not present

## 2020-07-14 DIAGNOSIS — M2042 Other hammer toe(s) (acquired), left foot: Secondary | ICD-10-CM | POA: Diagnosis not present

## 2020-07-14 DIAGNOSIS — M778 Other enthesopathies, not elsewhere classified: Secondary | ICD-10-CM

## 2020-07-14 DIAGNOSIS — L97523 Non-pressure chronic ulcer of other part of left foot with necrosis of muscle: Secondary | ICD-10-CM

## 2020-07-14 DIAGNOSIS — L03116 Cellulitis of left lower limb: Secondary | ICD-10-CM | POA: Diagnosis not present

## 2020-07-14 MED ORDER — DOXYCYCLINE HYCLATE 100 MG PO TABS
100.0000 mg | ORAL_TABLET | Freq: Two times a day (BID) | ORAL | 0 refills | Status: DC
Start: 2020-07-14 — End: 2024-11-21

## 2020-07-14 NOTE — Patient Instructions (Signed)
Apply betadine and gauze to the left third toe and a light gauze dressing. Walk in the surgical shoe.  Monitor for any signs/symptoms of infection. Signs of an infection could be redness beyond the site of the incision/procedure/wound, foul smelling odor, drainage that is thick and yellow or green, or severe swelling and pain. Call the office immediately if any occur or go directly to the emergency room. Call with any questions/concerns.

## 2020-07-14 NOTE — Addendum Note (Signed)
Addended by: Troy Sine on: 07/14/2020 04:34 PM   Modules accepted: Orders

## 2020-07-14 NOTE — Progress Notes (Signed)
  Subjective:  Patient ID: Tara [address], female    DOB: 21-Jan-1951,  MRN: [id number]  Chief Complaint  Patient presents with  . Wound Check    L 2nd toe. x1 wk. Pt stated, "I've had a callus under/on the tip of the toe. I trim my nails with scissors and I use an electric pumice stone on the callus. It opened at some point and turned into a wound. I also have cellulitis up to my leg now (warmth, redness, and swelling). No pain - I have neuropathy. It's draining pus, but there's no odor. No fever/chills/N&V".    69 y.o. female presents with the above complaint. History confirmed with patient. She has a history of idiopathic neuropathy, previously had an ulcer/callus on the left 3rd toe which was treated by Dr Charlsie Merles without issue. She noticed the left 2nd toe starting to swell and become red over the last week, and the redness spreading up the leg. Has noticed some purulent drainage  Objective:  Physical Exam: warm, good capillary refill, no trophic changes or ulcerative lesions, normal DP and PT pulses and normal sensory exam. Left Foot: edema, erythema, and epidermolysis surrounding the distal pulp of toe with a punctate 0.3 cm x 0.3cm x 0.5cm ulcer that probes to the distal phalanx. Mild purulent drainage, patchy cellulitis to mid calf    Radiographs: X-ray of the left foot: no clear evidence of osteolysis or destruction on the 2nd toe Assessment:   1. Cellulitis and abscess of left lower extremity   2. Skin ulcer of third toe of left foot with necrosis of muscle (HCC)   3. Hammertoe of left foot      Plan:  Patient was evaluated and treated and all questions answered.   -XR reviewed with patient -She is at high risk of partial toe amputation as the wound probes to the distal phalanx -Wound culture was taken -Doxycycline was prescribed to her pharmacy -Dressing applied consisting of betadine, DSD -Offload ulcer with surgical shoe -Wound cleansed and debrided  Procedure:  Excisional Debridement of Wound Indication: Removal of non-viable soft tissue from the wound to promote healing.  Anesthesia: none Pre-Debridement Wound Measurements: 0.3 cm x 0.3 cm x 0.5 cm  Post-Debridement Wound Measurements: 0.3 cm x 0.3 cm x 0.3 cm  Type of Debridement: Sharp Excisional Tissue Removed: Non-viable soft tissue Instrumentation: 15 blade and tissue nipper Depth of Debridement: subcutaneous tissue. Technique: Sharp excisional debridement to bleeding, viable wound base.  Dressing: Dry, sterile, compression dressing. Disposition: Patient tolerated procedure well. Patient to return in 1 week for follow-up.    Return in about 1 week (around 07/21/2020) for wound re-check.

## 2020-07-15 ENCOUNTER — Other Ambulatory Visit: Payer: Self-pay | Admitting: Podiatry

## 2020-07-15 DIAGNOSIS — L97523 Non-pressure chronic ulcer of other part of left foot with necrosis of muscle: Secondary | ICD-10-CM

## 2020-07-20 ENCOUNTER — Ambulatory Visit: Payer: PPO | Admitting: Podiatry

## 2020-07-20 ENCOUNTER — Other Ambulatory Visit: Payer: Self-pay

## 2020-07-20 ENCOUNTER — Encounter: Payer: Self-pay | Admitting: Podiatry

## 2020-07-20 VITALS — Temp 98.0°F

## 2020-07-20 DIAGNOSIS — L97523 Non-pressure chronic ulcer of other part of left foot with necrosis of muscle: Secondary | ICD-10-CM

## 2020-07-20 DIAGNOSIS — M8580 Other specified disorders of bone density and structure, unspecified site: Secondary | ICD-10-CM | POA: Insufficient documentation

## 2020-07-20 DIAGNOSIS — Z90711 Acquired absence of uterus with remaining cervical stump: Secondary | ICD-10-CM | POA: Insufficient documentation

## 2020-07-20 DIAGNOSIS — M2042 Other hammer toe(s) (acquired), left foot: Secondary | ICD-10-CM

## 2020-07-20 DIAGNOSIS — I10 Essential (primary) hypertension: Secondary | ICD-10-CM | POA: Insufficient documentation

## 2020-07-20 DIAGNOSIS — E663 Overweight: Secondary | ICD-10-CM | POA: Insufficient documentation

## 2020-07-20 DIAGNOSIS — N3281 Overactive bladder: Secondary | ICD-10-CM | POA: Insufficient documentation

## 2020-07-20 LAB — HOUSE ACCOUNT TRACKING

## 2020-07-20 LAB — WOUND CULTURE
MICRO NUMBER:: 10732494
SPECIMEN QUALITY:: ADEQUATE

## 2020-07-20 MED ORDER — MUPIROCIN 2 % EX OINT: TOPICAL_OINTMENT | CUTANEOUS | 2 refills | Status: AC

## 2020-07-20 NOTE — Progress Notes (Signed)
  Subjective:  Patient ID: Tara [address], female    DOB: 05-24-1951,  MRN: [id number]  Chief Complaint  Patient presents with  . Wound Check    Follow-up; Left foot; 2nd toe-top of toe; pt stated, "I have had some drainage with blood in it; still taking Doxycyline-thinks it is helping"    69 y.o. female presents with the above complaint. History confirmed with patient.  Thinks wound looks significantly better.  She and her husband have been applying clean bandages with a sulfur-based antibiotic ointment  Objective:  Physical Exam: warm, good capillary refill, no trophic changes or ulcerative lesions, normal DP and PT pulses and loss of protective sensation plantar foot  Left Foot: Improved significantly today with punctate lesion still remaining smaller, measures 0.2 x 0.2 x 0.2 cm no exposed bone.  Granular base.  Blistering skin has resolved and has dried blood.     Assessment:   1. Skin ulcer of third toe of left foot with necrosis of muscle (HCC)   2. Hammertoe of left foot      Plan:  Patient was evaluated and treated and all questions answered.  -Complete doxycycline course -Dressing applied consisting of betadine, DSD -Mupirocin ointment prescribed.  She will apply this on alternating days with her current antibiotic ointment -Offload ulcer with surgical shoe -Wound cleansed and debrided -If not improving will consider flexor tenotomy in the future  Procedure: Sharp selective debridement of Wound Indication: Removal of non-viable soft tissue from the wound to promote healing.  Anesthesia: none Pre-Debridement Wound Measurements: 0.2 cm x 0.2 cm x 0.2 cm  Post-Debridement Wound Measurements: 0.2 cm x 0.2 cm x 0.2 cm  Type of Debridement: Sharp selective Tissue Removed: Non-viable soft tissue Instrumentation: 15 blade and tissue nipper Depth of Debridement: subcutaneous tissue. Technique: Sharp selective debridement to bleeding, viable wound base.  Dressing: Dry,  sterile, compression dressing. Disposition: Patient tolerated procedure well. Patient to return in 1 week for follow-up.   Sharl Ma, DPM 07/20/2020    Return in about 2 weeks (around 08/03/2020) for wound re-check.

## 2020-08-05 ENCOUNTER — Other Ambulatory Visit: Payer: Self-pay

## 2020-08-05 ENCOUNTER — Ambulatory Visit: Payer: PPO | Admitting: Podiatry

## 2020-08-05 DIAGNOSIS — L97523 Non-pressure chronic ulcer of other part of left foot with necrosis of muscle: Secondary | ICD-10-CM

## 2020-08-05 DIAGNOSIS — M2042 Other hammer toe(s) (acquired), left foot: Secondary | ICD-10-CM | POA: Diagnosis not present

## 2020-08-05 NOTE — Progress Notes (Signed)
  Subjective:  Patient ID: Tara [address], female    DOB: [birth year],  MRN: [id number]  Chief Complaint  Patient presents with  . Foot Ulcer    2 week follow up left third toe    69 y.o. female presents with the above complaint. History confirmed with patient.  She feels well.  There have been minimal drainage.  She noticed some dry skin flaking off around the edges.  Objective:  Physical Exam: warm, good capillary refill, no trophic changes or ulcerative lesions, normal DP and PT pulses and loss of protective sensation plantar foot  Left Foot: Improved significantly today with punctate lesion still remaining smaller, measures 0.2 x 0.2 x 0.2 cm to the level of subcutaneous tissue.  Granular base.  Significant hyperkeratosis and surrounding callus formation, healed skin underneath this.  Third toe distal tip callus as well   Assessment:   1. Skin ulcer of third toe of left foot with necrosis of muscle (HCC)   2. Hammertoe of left foot      Plan:  Patient was evaluated and treated and all questions answered.  -Complete doxycycline course -Dressing applied consisting of Iodosorb, DSD -Continue mupirocin ointment alternated with Betadine daily -Wound cleansed and debrided -Continue offloading digit with toe crest pad, her digit today was not plantarflexed and hammered and seems to be rigidly straight.  Hopefully that this will resolve without the need for flexor tenotomy, although we will keep this in mind in the future if it does not work or regresses.  We will continue to monitor the third toe which now has a distal tip calluses and we may need to have a flexor tenotomy on this as well.   Procedure: Sharp selective debridement of Wound Indication: Removal of non-viable soft tissue from the wound to promote healing.  Anesthesia: none Pre-Debridement Wound Measurements: 0.2 cm x 0.2 cm x 0.2 cm  Post-Debridement Wound Measurements: 0.2 cm x 0.2 cm x 0.2 cm  Type of Debridement:  Sharp selective Tissue Removed: Non-viable soft tissue Instrumentation: 15 blade and tissue nipper Depth of Debridement: subcutaneous tissue. Technique: Sharp selective debridement to bleeding, viable wound base.  Dressing: Dry, sterile, compression dressing. Disposition: Patient tolerated procedure well. Patient to return in 1 week for follow-up.   Sharl Ma, DPM 08/05/2020    Return in about 3 weeks (around 08/26/2020) for wound re-check.

## 2020-08-26 ENCOUNTER — Ambulatory Visit: Payer: PPO | Admitting: Podiatry

## 2020-08-26 ENCOUNTER — Other Ambulatory Visit: Payer: Self-pay

## 2020-08-26 DIAGNOSIS — L84 Corns and callosities: Secondary | ICD-10-CM

## 2020-08-26 DIAGNOSIS — M2042 Other hammer toe(s) (acquired), left foot: Secondary | ICD-10-CM

## 2020-08-26 NOTE — Progress Notes (Signed)
  Subjective:  Patient ID: VIOLETA LECOUNT, female    DOB: 27-Aug-1951,  MRN: [id number]  Chief Complaint  Patient presents with  . Foot Ulcer    left toe, 3 week follow up    69 y.o. female presents with the above complaint. History confirmed with patient.  She feels well.  Only small spots of dried blood left at the distal second and third toes  Objective:  Physical Exam: warm, good capillary refill, no trophic changes or ulcerative lesions, normal DP and PT pulses and loss of protective sensation plantar foot  Left Foot: Second and third toe ulcerations have healed to preulcerative calluses.  Second toe is rigidly straight she has residual hammertoe deformities of the third through fifth toes which are semireducible.  She also has hallux malleus with prominent dorsal base of the proximal phalanx  Assessment:   1. Hammertoe of left foot   2. Callus of foot      Plan:  Patient was evaluated and treated and all questions answered.  -Nearly completely healed.  Would like to reevaluate in 4 weeks -The left second toe is rigidly straight, I suspect that she has loss of her flexors here.  The third fourth and fifth toes are all contracted and hammertoe and adductus positions.  She is at risk for developing transfer lesions or calluses ulceration on the soles as well.  I discussed with her that if she does not have significant provement with a preulcerative calluses at by her next visit we should consider outpatient surgical correction of the hammertoe deformities.  She will consider this.  Sharl Ma, DPM 08/26/2020    Return in about 4 weeks (around 09/23/2020).

## 2020-09-06 ENCOUNTER — Encounter: Payer: Self-pay | Admitting: Podiatry

## 2020-09-16 ENCOUNTER — Ambulatory Visit: Payer: PPO | Admitting: Podiatry

## 2020-10-29 DIAGNOSIS — H353131 Nonexudative age-related macular degeneration, bilateral, early dry stage: Secondary | ICD-10-CM | POA: Diagnosis not present

## 2020-10-29 DIAGNOSIS — H40023 Open angle with borderline findings, high risk, bilateral: Secondary | ICD-10-CM | POA: Diagnosis not present

## 2020-10-29 DIAGNOSIS — H524 Presbyopia: Secondary | ICD-10-CM | POA: Diagnosis not present

## 2020-10-29 DIAGNOSIS — H04123 Dry eye syndrome of bilateral lacrimal glands: Secondary | ICD-10-CM | POA: Diagnosis not present

## 2020-12-22 DIAGNOSIS — Z1231 Encounter for screening mammogram for malignant neoplasm of breast: Secondary | ICD-10-CM | POA: Diagnosis not present

## 2021-01-04 DIAGNOSIS — R32 Unspecified urinary incontinence: Secondary | ICD-10-CM | POA: Diagnosis not present

## 2021-01-04 DIAGNOSIS — Z01419 Encounter for gynecological examination (general) (routine) without abnormal findings: Secondary | ICD-10-CM | POA: Diagnosis not present

## 2021-01-04 DIAGNOSIS — Z1211 Encounter for screening for malignant neoplasm of colon: Secondary | ICD-10-CM | POA: Diagnosis not present

## 2021-01-04 DIAGNOSIS — M859 Disorder of bone density and structure, unspecified: Secondary | ICD-10-CM | POA: Diagnosis not present

## 2021-01-04 DIAGNOSIS — Z1231 Encounter for screening mammogram for malignant neoplasm of breast: Secondary | ICD-10-CM | POA: Diagnosis not present

## 2021-01-04 DIAGNOSIS — E559 Vitamin D deficiency, unspecified: Secondary | ICD-10-CM | POA: Diagnosis not present

## 2021-01-24 DIAGNOSIS — Z0000 Encounter for general adult medical examination without abnormal findings: Secondary | ICD-10-CM | POA: Diagnosis not present

## 2021-01-24 DIAGNOSIS — E785 Hyperlipidemia, unspecified: Secondary | ICD-10-CM | POA: Diagnosis not present

## 2021-01-24 DIAGNOSIS — N1831 Chronic kidney disease, stage 3a: Secondary | ICD-10-CM | POA: Diagnosis not present

## 2021-01-24 DIAGNOSIS — G609 Hereditary and idiopathic neuropathy, unspecified: Secondary | ICD-10-CM | POA: Diagnosis not present

## 2021-01-24 DIAGNOSIS — I10 Essential (primary) hypertension: Secondary | ICD-10-CM | POA: Diagnosis not present

## 2021-01-24 DIAGNOSIS — F330 Major depressive disorder, recurrent, mild: Secondary | ICD-10-CM | POA: Diagnosis not present

## 2021-01-24 DIAGNOSIS — E669 Obesity, unspecified: Secondary | ICD-10-CM | POA: Diagnosis not present

## 2021-07-25 DIAGNOSIS — Z23 Encounter for immunization: Secondary | ICD-10-CM | POA: Diagnosis not present

## 2021-07-25 DIAGNOSIS — F330 Major depressive disorder, recurrent, mild: Secondary | ICD-10-CM | POA: Diagnosis not present

## 2021-07-25 DIAGNOSIS — I10 Essential (primary) hypertension: Secondary | ICD-10-CM | POA: Diagnosis not present

## 2021-07-25 DIAGNOSIS — E785 Hyperlipidemia, unspecified: Secondary | ICD-10-CM | POA: Diagnosis not present

## 2021-07-25 DIAGNOSIS — G609 Hereditary and idiopathic neuropathy, unspecified: Secondary | ICD-10-CM | POA: Diagnosis not present

## 2021-07-25 DIAGNOSIS — R7309 Other abnormal glucose: Secondary | ICD-10-CM | POA: Diagnosis not present

## 2021-07-25 DIAGNOSIS — H9313 Tinnitus, bilateral: Secondary | ICD-10-CM | POA: Diagnosis not present

## 2021-08-01 DIAGNOSIS — H903 Sensorineural hearing loss, bilateral: Secondary | ICD-10-CM | POA: Diagnosis not present

## 2021-08-01 DIAGNOSIS — J343 Hypertrophy of nasal turbinates: Secondary | ICD-10-CM | POA: Diagnosis not present

## 2021-08-24 DIAGNOSIS — H9120 Sudden idiopathic hearing loss, unspecified ear: Secondary | ICD-10-CM | POA: Diagnosis not present

## 2021-08-24 DIAGNOSIS — H9190 Unspecified hearing loss, unspecified ear: Secondary | ICD-10-CM | POA: Diagnosis not present

## 2021-08-24 DIAGNOSIS — R9089 Other abnormal findings on diagnostic imaging of central nervous system: Secondary | ICD-10-CM | POA: Diagnosis not present

## 2021-08-24 DIAGNOSIS — R42 Dizziness and giddiness: Secondary | ICD-10-CM | POA: Diagnosis not present

## 2021-09-02 DIAGNOSIS — H903 Sensorineural hearing loss, bilateral: Secondary | ICD-10-CM | POA: Diagnosis not present

## 2021-10-31 DIAGNOSIS — H353131 Nonexudative age-related macular degeneration, bilateral, early dry stage: Secondary | ICD-10-CM | POA: Diagnosis not present

## 2021-10-31 DIAGNOSIS — Z961 Presence of intraocular lens: Secondary | ICD-10-CM | POA: Diagnosis not present

## 2021-10-31 DIAGNOSIS — H04123 Dry eye syndrome of bilateral lacrimal glands: Secondary | ICD-10-CM | POA: Diagnosis not present

## 2021-10-31 DIAGNOSIS — H40023 Open angle with borderline findings, high risk, bilateral: Secondary | ICD-10-CM | POA: Diagnosis not present

## 2021-12-28 DIAGNOSIS — Z1231 Encounter for screening mammogram for malignant neoplasm of breast: Secondary | ICD-10-CM | POA: Diagnosis not present

## 2022-01-09 DIAGNOSIS — N819 Female genital prolapse, unspecified: Secondary | ICD-10-CM | POA: Diagnosis not present

## 2022-01-09 DIAGNOSIS — Z6829 Body mass index (BMI) 29.0-29.9, adult: Secondary | ICD-10-CM | POA: Diagnosis not present

## 2022-01-09 DIAGNOSIS — N952 Postmenopausal atrophic vaginitis: Secondary | ICD-10-CM | POA: Diagnosis not present

## 2022-01-09 DIAGNOSIS — Z1231 Encounter for screening mammogram for malignant neoplasm of breast: Secondary | ICD-10-CM | POA: Diagnosis not present

## 2022-01-09 DIAGNOSIS — M8580 Other specified disorders of bone density and structure, unspecified site: Secondary | ICD-10-CM | POA: Diagnosis not present

## 2022-01-09 DIAGNOSIS — Z01419 Encounter for gynecological examination (general) (routine) without abnormal findings: Secondary | ICD-10-CM | POA: Diagnosis not present

## 2022-01-09 DIAGNOSIS — Z1211 Encounter for screening for malignant neoplasm of colon: Secondary | ICD-10-CM | POA: Diagnosis not present

## 2022-01-20 ENCOUNTER — Other Ambulatory Visit: Payer: Self-pay | Admitting: Obstetrics and Gynecology

## 2022-01-20 DIAGNOSIS — M8580 Other specified disorders of bone density and structure, unspecified site: Secondary | ICD-10-CM

## 2022-01-27 DIAGNOSIS — G609 Hereditary and idiopathic neuropathy, unspecified: Secondary | ICD-10-CM | POA: Diagnosis not present

## 2022-01-27 DIAGNOSIS — Z0000 Encounter for general adult medical examination without abnormal findings: Secondary | ICD-10-CM | POA: Diagnosis not present

## 2022-01-27 DIAGNOSIS — F330 Major depressive disorder, recurrent, mild: Secondary | ICD-10-CM | POA: Diagnosis not present

## 2022-01-27 DIAGNOSIS — R7309 Other abnormal glucose: Secondary | ICD-10-CM | POA: Diagnosis not present

## 2022-01-27 DIAGNOSIS — I10 Essential (primary) hypertension: Secondary | ICD-10-CM | POA: Diagnosis not present

## 2022-01-27 DIAGNOSIS — E538 Deficiency of other specified B group vitamins: Secondary | ICD-10-CM | POA: Diagnosis not present

## 2022-01-27 DIAGNOSIS — N1831 Chronic kidney disease, stage 3a: Secondary | ICD-10-CM | POA: Diagnosis not present

## 2022-01-27 DIAGNOSIS — E669 Obesity, unspecified: Secondary | ICD-10-CM | POA: Diagnosis not present

## 2022-01-27 DIAGNOSIS — E785 Hyperlipidemia, unspecified: Secondary | ICD-10-CM | POA: Diagnosis not present

## 2022-02-16 ENCOUNTER — Ambulatory Visit: Payer: PPO

## 2022-02-16 ENCOUNTER — Other Ambulatory Visit: Payer: Self-pay

## 2022-02-16 ENCOUNTER — Ambulatory Visit: Payer: PPO | Admitting: Podiatry

## 2022-02-16 ENCOUNTER — Ambulatory Visit (INDEPENDENT_AMBULATORY_CARE_PROVIDER_SITE_OTHER): Payer: PPO

## 2022-02-16 DIAGNOSIS — M779 Enthesopathy, unspecified: Secondary | ICD-10-CM

## 2022-02-16 DIAGNOSIS — L03119 Cellulitis of unspecified part of limb: Secondary | ICD-10-CM

## 2022-02-16 DIAGNOSIS — L02619 Cutaneous abscess of unspecified foot: Secondary | ICD-10-CM

## 2022-02-16 MED ORDER — DOXYCYCLINE HYCLATE 100 MG PO TABS: 100.0000 mg | ORAL_TABLET | Freq: Two times a day (BID) | ORAL | 1 refills | Status: DC

## 2022-02-17 NOTE — Progress Notes (Signed)
Subjective:   Patient ID: Tara [address], female   DOB: 71 y.o.   MRN: [id number]   HPI Patient presents stating she was working on the bottom of her right foot admitting she should not and she probably a braised it and it has turned red over the last number of days.  She does have profound neuropathy in her feet and develops lesion and was working on this against advice on her own   ROS      Objective:  Physical Exam  Vascular status intact neurologically significant diminishment of sharp dull vibratory which is not related to diabetes.  On the outside of the right foot there is a plantar abrasion of skin and on the lateral side with no current breakdown of skin no drainage noted but erythema in the forefoot right with no involvement of the ankle or lower leg.  Patient has had no temperature and her temperature currently is 99 and did feel like maybe she was not feeling well but seems to be feeling okay now     Assessment:  Patient who did self treatment of callus formation who developed probable irritation of the tissue with probable bacterial infiltration with no current appearance of abscess or active drainage that I can identify     Plan:  Precautionary x-ray taken sterile sharp debridement accomplished with no drainage to culture currently with warmth in the forefoot with patient placed on doxycycline 100 mg twice daily and wedge shoe so there is no pressure on the forefoot and strict instructions if any increased erythema should occur any systemic signs of infection she is to go straight to the emergency room and I want her taking her temperature several times a day.  I am hopeful this will respond to oral antibiotics but may require IV antibiotics may ultimately require the area to be opened up and could ultimately require bone resection  X-rays were negative for signs of osteolysis or indications of current bone infection with patient to be seen back 2 weeks or earlier if needed

## 2022-03-02 ENCOUNTER — Other Ambulatory Visit: Payer: Self-pay

## 2022-03-02 ENCOUNTER — Ambulatory Visit: Payer: PPO | Admitting: Podiatry

## 2022-03-02 ENCOUNTER — Other Ambulatory Visit: Payer: Self-pay | Admitting: Podiatry

## 2022-03-02 ENCOUNTER — Encounter: Payer: Self-pay | Admitting: Podiatry

## 2022-03-02 ENCOUNTER — Ambulatory Visit (INDEPENDENT_AMBULATORY_CARE_PROVIDER_SITE_OTHER): Payer: PPO

## 2022-03-02 DIAGNOSIS — M779 Enthesopathy, unspecified: Secondary | ICD-10-CM

## 2022-03-02 MED ORDER — DOXYCYCLINE HYCLATE 100 MG PO TABS: 100.0000 mg | ORAL_TABLET | Freq: Two times a day (BID) | ORAL | 1 refills | Status: DC

## 2022-03-03 LAB — CBC WITH DIFFERENTIAL/PLATELET
Basophils Absolute: 0.1 10*3/uL (ref 0.0–0.2)
Basos: 1 %
EOS (ABSOLUTE): 0.4 10*3/uL (ref 0.0–0.4)
Eos: 4 %
Hematocrit: 41 % (ref 34.0–46.6)
Hemoglobin: 13.4 g/dL (ref 11.1–15.9)
Immature Grans (Abs): 0 10*3/uL (ref 0.0–0.1)
Immature Granulocytes: 0 %
Lymphocytes Absolute: 2.7 10*3/uL (ref 0.7–3.1)
Lymphs: 29 %
MCH: 29.3 pg (ref 26.6–33.0)
MCHC: 32.7 g/dL (ref 31.5–35.7)
MCV: 90 fL (ref 79–97)
Monocytes Absolute: 0.6 10*3/uL (ref 0.1–0.9)
Monocytes: 7 %
Neutrophils Absolute: 5.5 10*3/uL (ref 1.4–7.0)
Neutrophils: 59 %
Platelets: 279 10*3/uL (ref 150–450)
RBC: 4.58 x10E6/uL (ref 3.77–5.28)
RDW: 11.9 % (ref 11.7–15.4)
WBC: 9.2 10*3/uL (ref 3.4–10.8)

## 2022-03-03 LAB — SEDIMENTATION RATE: Sed Rate: 14 mm/hr (ref 0–40)

## 2022-03-03 NOTE — Progress Notes (Signed)
Subjective:  ? ?Patient ID: Tara [address], female   DOB: 71 y.o.   MRN: [id number]  ? ?HPI ?Patient presents stating that she is improving with her foot but still notices some redness and it is still localized around the fifth metatarsal and she is not having drainage anymore and is continuing soaks ? ? ?ROS ? ? ?   ?Objective:  ?Physical Exam  ?Neurovascular status unchanged from previous visit with erythema which has reduced around the fifth metatarsal with swelling still noted in the area but localized and a small plantar area that is no longer draining measuring about 3 x 3 mm that is crusted over and appears to be healing well ? ?   ?Assessment:  ?Still has some erythema with no systemic signs of infection in the forefoot midfoot right secondary to a self-inflicted trauma to the head of the fifth metatarsal right foot with breakdown of skin which is improving ? ?   ?Plan:  ?H&P x-ray reviewed and I am sending for blood work as Immunologist and I went ahead today I want to continue her on the doxycycline for 2 more weeks and the wedge shoe so there is no pressure on the forefoot.  Strict instructions given again that if any changes were to occur any systemic signs of infection she is to go to the emergency room and she still does understand the possibility for bone resection does exist at 1 point in future depending on how she responds ? ?X-rays indicate that there is no signs of obvious osteolysis of the bone there could be some stress on the fifth and fourth metatarsals but at this point I do think that is not an issue especially if she continues to improve clinically ?   ? ? ?

## 2022-03-17 DIAGNOSIS — H903 Sensorineural hearing loss, bilateral: Secondary | ICD-10-CM | POA: Diagnosis not present

## 2022-05-01 ENCOUNTER — Other Ambulatory Visit: Payer: Self-pay | Admitting: Podiatry

## 2022-05-01 ENCOUNTER — Encounter: Payer: Self-pay | Admitting: Podiatry

## 2022-05-01 ENCOUNTER — Ambulatory Visit (INDEPENDENT_AMBULATORY_CARE_PROVIDER_SITE_OTHER): Payer: PPO | Admitting: Podiatry

## 2022-05-01 ENCOUNTER — Ambulatory Visit (INDEPENDENT_AMBULATORY_CARE_PROVIDER_SITE_OTHER): Payer: PPO

## 2022-05-01 DIAGNOSIS — L02619 Cutaneous abscess of unspecified foot: Secondary | ICD-10-CM | POA: Diagnosis not present

## 2022-05-01 DIAGNOSIS — M779 Enthesopathy, unspecified: Secondary | ICD-10-CM

## 2022-05-01 DIAGNOSIS — L03119 Cellulitis of unspecified part of limb: Secondary | ICD-10-CM

## 2022-05-01 MED ORDER — DOXYCYCLINE HYCLATE 100 MG PO TABS: 100.0000 mg | ORAL_TABLET | Freq: Two times a day (BID) | ORAL | 1 refills | Status: DC

## 2022-05-01 NOTE — Progress Notes (Signed)
Subjective:  ? ?Patient ID: Tara [address], female   DOB: 71 y.o.   MRN: [id number]  ? ?HPI ?Patient states overall she has been doing okay for the last 2 months but she still has some redness around the outside of her foot and at night it drains.  States she has not been using the wedge shoe we have given her as it is too uncomfortable she does walk in a [employer] like shoe and states that she is not having pain ? ? ?ROS ? ? ?   ?Objective:  ?Physical Exam  ?No neurovascular status found to be unchanged with an area of keratotic tissue plantar fifth metatarsal head right that is localized and does have some erythema around the area no proximal calor or any systemic signs of infection ? ?   ?Assessment:  ?Difficult to make complete determination to the redness with patient having good pulses I did note some irregularity in the heart rate and she is going to see her doctor for this but does not have what appears to be active infection currently but does still have redness ? ?   ?Plan:  ?H&P explained and took x-rays.  At this point I want her back on the doxycycline for several weeks and I am ordering blood work to include CBC with differential and sed rate.  I feel this will heal uneventfully but I still want to watch the redness and I gave her strict instructions of any changes or coronas here immediately continue home soaks and dressing changes ? ?X-rays do not indicate signs of bone infection osteolysis appears still to be soft tissue ?   ? ? ?

## 2022-05-02 DIAGNOSIS — H40023 Open angle with borderline findings, high risk, bilateral: Secondary | ICD-10-CM | POA: Diagnosis not present

## 2022-05-02 LAB — CBC WITH DIFFERENTIAL/PLATELET
Basophils Absolute: 0.1 10*3/uL (ref 0.0–0.2)
Basos: 1 %
EOS (ABSOLUTE): 0.3 10*3/uL (ref 0.0–0.4)
Eos: 4 %
Hematocrit: 37 % (ref 34.0–46.6)
Hemoglobin: 12.1 g/dL (ref 11.1–15.9)
Immature Grans (Abs): 0 10*3/uL (ref 0.0–0.1)
Immature Granulocytes: 0 %
Lymphocytes Absolute: 2.4 10*3/uL (ref 0.7–3.1)
Lymphs: 26 %
MCH: 29.4 pg (ref 26.6–33.0)
MCHC: 32.7 g/dL (ref 31.5–35.7)
MCV: 90 fL (ref 79–97)
Monocytes Absolute: 0.9 10*3/uL (ref 0.1–0.9)
Monocytes: 9 %
Neutrophils Absolute: 5.6 10*3/uL (ref 1.4–7.0)
Neutrophils: 60 %
Platelets: 309 10*3/uL (ref 150–450)
RBC: 4.11 x10E6/uL (ref 3.77–5.28)
RDW: 12.2 % (ref 11.7–15.4)
WBC: 9.2 10*3/uL (ref 3.4–10.8)

## 2022-05-02 LAB — SEDIMENTATION RATE: Sed Rate: 60 mm/hr — ABNORMAL HIGH (ref 0–40)

## 2022-05-05 ENCOUNTER — Ambulatory Visit: Payer: PPO | Admitting: Podiatry

## 2022-06-23 ENCOUNTER — Ambulatory Visit
Admission: RE | Admit: 2022-06-23 | Discharge: 2022-06-23 | Disposition: A | Discharge status: Home / Self Care | Payer: PPO | Source: Ambulatory Visit | Attending: Obstetrics and Gynecology | Admitting: Obstetrics and Gynecology

## 2022-06-23 DIAGNOSIS — Z780 Asymptomatic menopausal state: Secondary | ICD-10-CM | POA: Diagnosis not present

## 2022-06-23 DIAGNOSIS — M8580 Other specified disorders of bone density and structure, unspecified site: Secondary | ICD-10-CM

## 2022-06-23 DIAGNOSIS — M8589 Other specified disorders of bone density and structure, multiple sites: Secondary | ICD-10-CM | POA: Diagnosis not present

## 2022-06-24 DIAGNOSIS — L089 Local infection of the skin and subcutaneous tissue, unspecified: Secondary | ICD-10-CM | POA: Diagnosis not present

## 2022-06-29 DIAGNOSIS — L089 Local infection of the skin and subcutaneous tissue, unspecified: Secondary | ICD-10-CM | POA: Diagnosis not present

## 2022-07-05 ENCOUNTER — Ambulatory Visit: Payer: PPO | Admitting: Podiatry

## 2022-07-07 ENCOUNTER — Other Ambulatory Visit: Payer: Self-pay | Admitting: Physician Assistant

## 2022-07-07 DIAGNOSIS — L089 Local infection of the skin and subcutaneous tissue, unspecified: Secondary | ICD-10-CM

## 2022-07-20 ENCOUNTER — Ambulatory Visit
Admission: RE | Admit: 2022-07-20 | Discharge: 2022-07-20 | Disposition: A | Discharge status: Home / Self Care | Payer: PPO | Source: Ambulatory Visit | Attending: Physician Assistant | Admitting: Physician Assistant

## 2022-07-20 DIAGNOSIS — L089 Local infection of the skin and subcutaneous tissue, unspecified: Secondary | ICD-10-CM

## 2022-07-20 DIAGNOSIS — I872 Venous insufficiency (chronic) (peripheral): Secondary | ICD-10-CM | POA: Diagnosis not present

## 2022-07-20 NOTE — Consult Note (Signed)
Chief Complaint:  Recent right lower extremity cellulitis  Referring Physician(s): Hinman,Stephen P  History of Present Illness: Tara Santana is a 71 y.o. female who recently had a lateral right foot infection related to treating a callus.  She has diabetes and peripheral neuropathy.  This led to right foot redness, swelling, and warmth consistent with cellulitis.  She has been receiving antibiotics.  Since changing the antibiotic she has noticed significant improvement in the foot infection.  She has sent today for evaluation of venous insufficiency.  Review of her venous history performed.  She has had no prior treatments for varicose veins or spider veins.  No history of skin ulceration or lesions.  No prior DVT.  She does have a family history of varicose veins which affected her mother.  She currently does not take any hormone replacement therapies.  She does not wear prescription strength support stockings.  She is retired.  She remains active with daily walking and has a excellent functional status.  No significant leg pain or discomfort.  No past medical history on file.  No past surgical history on file.  Allergies: Versed [midazolam], Carbamazepine, Fentanyl, and Morphine and related  Medications: Prior to Admission medications   Medication Sig Start Date End Date Taking? Authorizing Provider  buPROPion (WELLBUTRIN SR) 150 MG 12 hr tablet Take 150 mg by mouth 2 (two) times daily. 06/10/20   [provider]  Cholecalciferol 125 MCG (5000 UT) TABS Vitamin D3 125 mcg (5,000 unit) tablet  Take 1 tablet every day by oral route.    [provider]  doxycycline (VIBRA-TABS) 100 MG tablet Take 1 tablet (100 mg total) by mouth 2 (two) times daily. 07/14/20   McDonald, Rachelle Hora, DPM  doxycycline (VIBRA-TABS) 100 MG tablet Take 1 tablet (100 mg total) by mouth 2 (two) times daily. 02/16/22   Lenn Sink, DPM  doxycycline (VIBRA-TABS) 100 MG tablet Take 1 tablet  (100 mg total) by mouth 2 (two) times daily. 03/02/22   Lenn Sink, DPM  doxycycline (VIBRA-TABS) 100 MG tablet Take 1 tablet (100 mg total) by mouth 2 (two) times daily. 05/01/22   Lenn Sink, DPM  DULoxetine (CYMBALTA) 60 MG capsule Take 60 mg by mouth daily.    [provider]  gabapentin (NEURONTIN) 800 MG tablet Take 800 mg by mouth 3 (three) times daily.    [provider]  lisinopril (PRINIVIL,ZESTRIL) 10 MG tablet Take 10 mg by mouth daily.    [provider]  mupirocin ointment (BACTROBAN) 2 % Apply to wound every other daya 07/20/20   Edwin Cap, DPM     No family history on file.  Social History   Socioeconomic History   Marital status: Married    Spouse name: Not on file   Number of children: Not on file   Years of education: Not on file   Highest education level: Not on file  Occupational History   Not on file  Tobacco Use   Smoking status: Never   Smokeless tobacco: Never  Substance and Sexual Activity   Alcohol use: Not on file   Drug use: Not on file   Sexual activity: Not on file  Other Topics Concern   Not on file  Social History Narrative   Not on file   Social Determinants of Health   Financial Resource Strain: Not on file  Food Insecurity: Not on file  Transportation Needs: Not on file  Physical Activity: Not on  file  Stress: Not on file  Social Connections: Not on file     Review of Systems: A 12 point ROS discussed and pertinent positives are indicated in the HPI above.  All other systems are negative.  Review of Systems  Vital Signs: There were no vitals taken for this visit.   Physical Exam Constitutional:      General: She is not in acute distress.    Appearance: She is not toxic-appearing or diaphoretic.  Eyes:     General: No scleral icterus.    Conjunctiva/sclera: Conjunctivae normal.  Musculoskeletal:        General: No tenderness, deformity or signs of injury. Normal range of motion.      Right lower leg: Edema present.     Comments: Upon standing, she has very early subsurface varicosities in the right medial calf and tibial region.  Asymmetric redness and mild edema in the right ankle and foot.  Skin:    General: Skin is warm and dry.     Capillary Refill: Capillary refill takes less than 2 seconds.     Findings: Erythema present. No bruising or lesion.  Neurological:     General: No focal deficit present.     Mental Status: Mental status is at baseline.     Cranial Nerves: No cranial nerve deficit.  Psychiatric:        Mood and Affect: Mood normal.        Thought Content: Thought content normal.       Imaging: US Venous Img Lower Unilateral Right (DVT)  Result Date: 07/20/2022 CLINICAL DATA:  Foot infection, concern for venous insufficiency EXAM: RIGHT LOWER EXTREMITY VENOUS DOPPLER ULTRASOUND TECHNIQUE: Gray-scale sonography with graded compression, as well as color Doppler and duplex ultrasound were performed to evaluate the lower extremity deep venous systems from the level of the common femoral vein and including the common femoral, femoral, profunda femoral, popliteal and calf veins including the posterior tibial, peroneal and gastrocnemius veins when visible. The superficial great saphenous vein was also interrogated. Spectral Doppler was utilized to evaluate flow at rest and with distal augmentation maneuvers in the common femoral, femoral and popliteal veins. COMPARISON:  None Available. FINDINGS: Contralateral Common Femoral Vein: Respiratory phasicity is normal and symmetric with the symptomatic side. No evidence of thrombus. Normal compressibility. Common Femoral Vein: No evidence of thrombus. Normal compressibility, respiratory phasicity and response to augmentation. Saphenofemoral Junction: No evidence of thrombus. Normal compressibility and flow on color Doppler imaging. Negative for venous insufficiency Profunda Femoral Vein: No evidence of thrombus. Normal  compressibility and flow on color Doppler imaging. Femoral Vein: No evidence of thrombus. Normal compressibility, respiratory phasicity and response to augmentation. Popliteal Vein: No evidence of thrombus. Normal compressibility, respiratory phasicity and response to augmentation. Calf Veins: No evidence of thrombus. Normal compressibility and flow on color Doppler imaging. Superficial Great Saphenous Vein: Mild diffuse venous insufficiency, 1-2 seconds in length. Maximal diameter in the lower calf region 6 mm. Small calf region sub surface branching varicosities. Venous Reflux:  Positive as above IMPRESSION: Negative for right lower extremity DVT. Very mild diffuse right GSV venous insufficiency and early branching right calf region sub surface varicosities. Electronically Signed   By: Judie Petit.  Author [place] M.D.   On: 07/20/2022 09:59   DG BONE DENSITY (DXA)  Result Date: 06/23/2022 EXAM: DUAL X-RAY ABSORPTIOMETRY (DXA) FOR BONE MINERAL DENSITY IMPRESSION: Referring Physician:  Silverio Lay Your patient completed a bone mineral density test using GE Lunar iDXA system (analysis version: 16).  Technologist: [place] PATIENT: Name: Tara Santana, Tara Santana Patient ID: [id number] Birth Date: [birth year] Height: 67.0 in. Sex: Female Measured: 06/23/2022 Weight: 198.0 lbs. Indications: Advanced Age, Estrogen Deficient, Family Hist. (Parent hip fracture), Hysterectomy, Postmenopausal, Scoliosis Fractures: NONE Treatments: Calcium (E943.0), Vitamin D (E933.5) ASSESSMENT: The BMD measured at AP Spine L1-L2 is 0.890 g/cm2 with a T-score of -2.3. This patient is considered osteopenic/low bone mass according to [facility]) criteria. The quality of the exam is good. L3, L4 was excluded due to degenerative changes. Site Region Measured Date Measured Age YA BMD Significant CHANGE T-score AP Spine L1-L2 06/23/2021 72.3 -2.3 0.890 g/cm2 DualFemur Neck Right 06/23/2022 72.3 -2.0 0.763 g/cm2 DualFemur Total Mean 06/23/2022 72.3 -1.7  0.795 g/cm2 Left Forearm Radius 33% 06/23/2022 72.3 -0.7 0.811 g/cm2 World Health Organization [facility]) criteria for post-menopausal, Caucasian Women: Normal       T-score at or above -1 SD Osteopenia   T-score between -1 and -2.5 SD Osteoporosis T-score at or below -2.5 SD RECOMMENDATION: 1. All patients should optimize calcium and vitamin D intake. 2. Consider FDA-approved medical therapies in postmenopausal women and men aged 72 years and older, based on the following: a. A hip or vertebral (clinical or morphometric) fracture. b. T-score = -2.5 at the femoral neck or spine after appropriate evaluation to exclude secondary causes. c. Low bone mass (T-score between -1.0 and -2.5 at the femoral neck or spine) and a 10-year probability of a hip fracture = 3% or a 10-year probability of a major osteoporosis-related fracture = 20% based on the [place]-adapted WHO algorithm. d. Clinician judgment and/or patient preferences may indicate treatment for people with 10-year fracture probabilities above or below these levels. FOLLOW-UP: Patients with diagnosis of osteoporosis or at high risk for fracture should have regular bone mineral density tests.? Patients eligible for Medicare are allowed routine testing every 2 years.? The testing frequency can be increased to one year for patients who have rapidly progressing disease, are receiving or discontinuing medical therapy to restore bone mass, or have additional risk factors. I have reviewed this study and agree with the findings. [facility] FRAX* 10-year Probability of Fracture Based on femoral neck BMD: DualFemur (Right) Major Osteoporotic Fracture: 19.3% Hip Fracture:                7.4% Population:                  [place] (Caucasian) Risk Factors:                Family Hist. (Parent hip fracture) *FRAX is a [employer] for Metabolic Bone Disease, a [place] (WHO) [facility]. ASSESSMENT:  The probability of a major osteoporotic fracture is 19.3 % within the next ten years. The probability of a hip fracture is 7.4 % within the next ten years. Electronically Signed   By: Lajean Manes M.D.   On: 06/23/2022 14:39    Labs:  CBC: Recent Labs    03/02/22 1421 05/01/22 1605  WBC 9.2 9.2  HGB 13.4 12.1  HCT 41.0 37.0  PLT 279 309    COAGS: No results for input(s): "INR", "APTT" in the last 8760 hours.  BMP: No results for input(s): "NA", "K", "CL", "CO2", "GLUCOSE", "BUN", "CALCIUM", "CREATININE", "GFRNONAA", "GFRAA" in the last 8760 hours.  Invalid input(s): "CMP"  LIVER FUNCTION TESTS: No results for input(s): "BILITOT", "AST", "ALT", "ALKPHOS", "PROT", "ALBUMIN" in the last 8760 hours.  TAssessment  and Plan:  Very minor diffuse right GSV venous insufficiency with early calf region branching varicosities.  She currently is asymptomatic related to the mild venous insufficiency.  At this point I would not recommend any intervention such as injections, laser treatment, or vein removal.  Conservative management plan was discussed.  All questions addressed.  Plan: Conservative management with knee or thigh-high prescription strength compression stockings as discussed with the patient.  She is familiar with these as her husband wears them daily.  Also continue daily walking.  Follow-up as needed for any worsening calf or tibial varicosities or developing symptoms.  Thank you for this interesting consult.  I greatly enjoyed meeting Tara Santana and look forward to participating in their care.  A copy of this report was sent to the requesting provider on this date.  Electronically Signed: Berdine Dance 07/20/2022, 10:08 AM   I spent a total of  40 Minutes   in face to face in clinical consultation, greater than 50% of which was counseling/coordinating care for this patient with mild venous insufficiency and a recent right foot infection

## 2022-07-28 DIAGNOSIS — I10 Essential (primary) hypertension: Secondary | ICD-10-CM | POA: Diagnosis not present

## 2022-07-28 DIAGNOSIS — F419 Anxiety disorder, unspecified: Secondary | ICD-10-CM | POA: Diagnosis not present

## 2022-07-28 DIAGNOSIS — G609 Hereditary and idiopathic neuropathy, unspecified: Secondary | ICD-10-CM | POA: Diagnosis not present

## 2022-07-28 DIAGNOSIS — R7309 Other abnormal glucose: Secondary | ICD-10-CM | POA: Diagnosis not present

## 2022-07-28 DIAGNOSIS — M25551 Pain in right hip: Secondary | ICD-10-CM | POA: Diagnosis not present

## 2022-08-02 DIAGNOSIS — M25551 Pain in right hip: Secondary | ICD-10-CM | POA: Diagnosis not present

## 2022-08-02 DIAGNOSIS — M2042 Other hammer toe(s) (acquired), left foot: Secondary | ICD-10-CM | POA: Diagnosis not present

## 2022-09-04 ENCOUNTER — Ambulatory Visit: Payer: PPO | Admitting: Podiatry

## 2022-09-04 DIAGNOSIS — M2042 Other hammer toe(s) (acquired), left foot: Secondary | ICD-10-CM | POA: Diagnosis not present

## 2022-09-05 NOTE — Progress Notes (Signed)
  Subjective:  Patient ID: Tara Santana, female    DOB: 1951/08/10,  MRN: [id number]  Chief Complaint  Patient presents with   Tara Santana    Xrays done in May 2023. Patient requested to see Dr. Lilian Kapur - wants to talks about tenotomy for left 3rd toe    71 y.o. female presents with the above complaint. History confirmed with patient.   Objective:  Physical Exam: warm, good capillary refill, no trophic changes or ulcerative lesions, normal DP and PT pulses, normal sensory exam, and she has semirigid hammertoe deformities of the lesser toes on the left foot there is partial-thickness preulcerative callus on the left tip of the third toe.   Radiographs: Multiple views x-ray of the left foot: Previous radiographs from 07/14/2020 reviewed there is contracture of the digits on the left foot Assessment:   1. Hammertoe of left foot      Plan:  Patient was evaluated and treated and all questions answered.  We discussed further treatment options for her hammertoes on the left foot.  We discussed surgical and nonsurgical treatment options.  So far she has tried alternate shoe gear and offloading pads and this has not been successful.  Surgical we discussed the option of a flexor tenotomy and we discussed the risk benefits and potential complications of this procedure.  I discussed with her this may not provide complete correction of the deformity and arthroplasty may still be indicated at some point.  She understood and wished to proceed.  All questions were addressed prior to the procedure.  Left third toe was then anesthetized with 1.5 cc each of 2% lidocaine and 05% Marcaine plain.  Following this it was prepped and draped with Betadine.  A manual tourniquet was applied around the base of the toe for hemostasis.  Utilizing a #11 blade the plantar toe was punctured and the long flexor tendon was transected.  The toe was dorsiflexed and some reduction of the deformity was noted.  A sterile  bandage of antibiotic ointment, nonadherent gauze and gauze and Coban was then applied.  Post care instructions were given.  I will see her back in 4 weeks for follow-up for postoperative visit  Return in about 4 weeks (around 10/02/2022) for tenotomy follow up .

## 2022-10-02 ENCOUNTER — Ambulatory Visit: Payer: PPO | Admitting: Podiatry

## 2022-10-02 DIAGNOSIS — M2042 Other hammer toe(s) (acquired), left foot: Secondary | ICD-10-CM

## 2022-10-02 NOTE — Progress Notes (Signed)
  Subjective:  Patient ID: Tara Santana, female    DOB: 09-22-1951,  MRN: [id number]  Chief Complaint  Patient presents with   Hammer Toe    4 week follow up after tenotomy - left 3rd toe - very pleased with her results   71 y.o. female returns for post-op check.  Doing very well she is interested in having this possibly done on the fourth and fifth toes  Review of Systems: Negative except as noted in the HPI. Denies N/V/F/Ch.   Objective:  There were no vitals filed for this visit. There is no height or weight on file to calculate BMI. Constitutional Well developed. Well nourished.  Vascular Foot warm and well perfused. Capillary refill normal to all digits.  Calf is soft and supple, no posterior calf or knee pain, negative Homans' sign  Neurologic Normal speech. Oriented to person, place, and time. Epicritic sensation to light touch grossly present bilaterally.  Dermatologic Skin healing well without signs of infection. Skin edges well coapted without signs of infection.  Orthopedic: She has no pain significant improvement in alignment of third toe   Assessment:   1. Hammertoe of left foot    Plan:  Patient was evaluated and treated and all questions answered.  S/p foot surgery left -Progressing as expected post-operatively. -Doing much better significant improvement in the alignment of the toe.  She discussed the possibility of doing this in the fourth and fifth toes.  The fifth toe seems to have a bony block to the contracture that likely would not completely reduce.  The fourth toe is completely reducible currently this is asymptomatic for her there is no pain or callus in the toe.  I discussed with her that while she did very well with this when there is never any guarantee with any procedural options and I would likely recommend if it is asymptomatic we should continue to monitor.  If she does begin to develop any sort of callus or blistering on the fourth or fifth toes and  we will proceed with tenotomy in the office as well.  I will see her back as needed  No follow-ups on file.

## 2022-11-08 DIAGNOSIS — H903 Sensorineural hearing loss, bilateral: Secondary | ICD-10-CM | POA: Diagnosis not present

## 2022-11-10 DIAGNOSIS — H903 Sensorineural hearing loss, bilateral: Secondary | ICD-10-CM | POA: Diagnosis not present

## 2022-11-10 DIAGNOSIS — H9313 Tinnitus, bilateral: Secondary | ICD-10-CM | POA: Diagnosis not present

## 2022-11-10 DIAGNOSIS — R0981 Nasal congestion: Secondary | ICD-10-CM | POA: Diagnosis not present

## 2022-11-21 DIAGNOSIS — H524 Presbyopia: Secondary | ICD-10-CM | POA: Diagnosis not present

## 2022-11-21 DIAGNOSIS — H40023 Open angle with borderline findings, high risk, bilateral: Secondary | ICD-10-CM | POA: Diagnosis not present

## 2022-11-21 DIAGNOSIS — H04123 Dry eye syndrome of bilateral lacrimal glands: Secondary | ICD-10-CM | POA: Diagnosis not present

## 2022-11-21 DIAGNOSIS — Z961 Presence of intraocular lens: Secondary | ICD-10-CM | POA: Diagnosis not present

## 2022-11-21 DIAGNOSIS — H353131 Nonexudative age-related macular degeneration, bilateral, early dry stage: Secondary | ICD-10-CM | POA: Diagnosis not present

## 2023-01-30 DIAGNOSIS — N819 Female genital prolapse, unspecified: Secondary | ICD-10-CM | POA: Diagnosis not present

## 2023-01-30 DIAGNOSIS — Z1211 Encounter for screening for malignant neoplasm of colon: Secondary | ICD-10-CM | POA: Diagnosis not present

## 2023-01-30 DIAGNOSIS — M8580 Other specified disorders of bone density and structure, unspecified site: Secondary | ICD-10-CM | POA: Diagnosis not present

## 2023-01-30 DIAGNOSIS — Z01419 Encounter for gynecological examination (general) (routine) without abnormal findings: Secondary | ICD-10-CM | POA: Diagnosis not present

## 2023-01-30 DIAGNOSIS — Z1231 Encounter for screening mammogram for malignant neoplasm of breast: Secondary | ICD-10-CM | POA: Diagnosis not present

## 2023-01-30 DIAGNOSIS — N952 Postmenopausal atrophic vaginitis: Secondary | ICD-10-CM | POA: Diagnosis not present

## 2023-01-30 DIAGNOSIS — Z6829 Body mass index (BMI) 29.0-29.9, adult: Secondary | ICD-10-CM | POA: Diagnosis not present

## 2023-02-06 DIAGNOSIS — G609 Hereditary and idiopathic neuropathy, unspecified: Secondary | ICD-10-CM | POA: Diagnosis not present

## 2023-02-06 DIAGNOSIS — E669 Obesity, unspecified: Secondary | ICD-10-CM | POA: Diagnosis not present

## 2023-02-06 DIAGNOSIS — R7309 Other abnormal glucose: Secondary | ICD-10-CM | POA: Diagnosis not present

## 2023-02-06 DIAGNOSIS — G43809 Other migraine, not intractable, without status migrainosus: Secondary | ICD-10-CM | POA: Diagnosis not present

## 2023-02-06 DIAGNOSIS — I10 Essential (primary) hypertension: Secondary | ICD-10-CM | POA: Diagnosis not present

## 2023-02-06 DIAGNOSIS — E538 Deficiency of other specified B group vitamins: Secondary | ICD-10-CM | POA: Diagnosis not present

## 2023-02-06 DIAGNOSIS — E785 Hyperlipidemia, unspecified: Secondary | ICD-10-CM | POA: Diagnosis not present

## 2023-02-06 DIAGNOSIS — F330 Major depressive disorder, recurrent, mild: Secondary | ICD-10-CM | POA: Diagnosis not present

## 2023-02-06 DIAGNOSIS — Z0000 Encounter for general adult medical examination without abnormal findings: Secondary | ICD-10-CM | POA: Diagnosis not present

## 2023-05-31 DIAGNOSIS — H04123 Dry eye syndrome of bilateral lacrimal glands: Secondary | ICD-10-CM | POA: Diagnosis not present

## 2023-05-31 DIAGNOSIS — H40023 Open angle with borderline findings, high risk, bilateral: Secondary | ICD-10-CM | POA: Diagnosis not present

## 2023-06-18 DIAGNOSIS — S0181XA Laceration without foreign body of other part of head, initial encounter: Secondary | ICD-10-CM | POA: Diagnosis not present

## 2023-06-18 DIAGNOSIS — S8991XA Unspecified injury of right lower leg, initial encounter: Secondary | ICD-10-CM | POA: Diagnosis not present

## 2023-06-18 DIAGNOSIS — S81011A Laceration without foreign body, right knee, initial encounter: Secondary | ICD-10-CM | POA: Diagnosis not present

## 2023-06-18 DIAGNOSIS — S80811A Abrasion, right lower leg, initial encounter: Secondary | ICD-10-CM | POA: Diagnosis not present

## 2023-06-18 DIAGNOSIS — W19XXXA Unspecified fall, initial encounter: Secondary | ICD-10-CM | POA: Diagnosis not present

## 2023-06-18 DIAGNOSIS — S82024A Nondisplaced longitudinal fracture of right patella, initial encounter for closed fracture: Secondary | ICD-10-CM | POA: Diagnosis not present

## 2023-06-18 DIAGNOSIS — M25561 Pain in right knee: Secondary | ICD-10-CM | POA: Diagnosis not present

## 2023-06-29 DIAGNOSIS — M25561 Pain in right knee: Secondary | ICD-10-CM | POA: Diagnosis not present

## 2023-08-09 DIAGNOSIS — E785 Hyperlipidemia, unspecified: Secondary | ICD-10-CM | POA: Diagnosis not present

## 2023-08-09 DIAGNOSIS — I10 Essential (primary) hypertension: Secondary | ICD-10-CM | POA: Diagnosis not present

## 2023-08-09 DIAGNOSIS — F321 Major depressive disorder, single episode, moderate: Secondary | ICD-10-CM | POA: Diagnosis not present

## 2023-08-09 DIAGNOSIS — G609 Hereditary and idiopathic neuropathy, unspecified: Secondary | ICD-10-CM | POA: Diagnosis not present

## 2023-08-09 DIAGNOSIS — Z9181 History of falling: Secondary | ICD-10-CM | POA: Diagnosis not present

## 2023-08-09 DIAGNOSIS — R7303 Prediabetes: Secondary | ICD-10-CM | POA: Diagnosis not present

## 2023-12-03 DIAGNOSIS — H524 Presbyopia: Secondary | ICD-10-CM | POA: Diagnosis not present

## 2023-12-03 DIAGNOSIS — H04123 Dry eye syndrome of bilateral lacrimal glands: Secondary | ICD-10-CM | POA: Diagnosis not present

## 2023-12-03 DIAGNOSIS — H401131 Primary open-angle glaucoma, bilateral, mild stage: Secondary | ICD-10-CM | POA: Diagnosis not present

## 2023-12-03 DIAGNOSIS — Z961 Presence of intraocular lens: Secondary | ICD-10-CM | POA: Diagnosis not present

## 2023-12-17 DIAGNOSIS — Z111 Encounter for screening for respiratory tuberculosis: Secondary | ICD-10-CM | POA: Diagnosis not present

## 2023-12-24 ENCOUNTER — Other Ambulatory Visit: Payer: Self-pay | Admitting: Obstetrics and Gynecology

## 2023-12-24 DIAGNOSIS — Z1231 Encounter for screening mammogram for malignant neoplasm of breast: Secondary | ICD-10-CM

## 2024-02-01 ENCOUNTER — Ambulatory Visit
Admission: RE | Admit: 2024-02-01 | Discharge: 2024-02-01 | Disposition: A | Discharge status: Home / Self Care | Payer: PPO | Source: Ambulatory Visit | Attending: Obstetrics and Gynecology | Admitting: Obstetrics and Gynecology

## 2024-02-01 DIAGNOSIS — Z1231 Encounter for screening mammogram for malignant neoplasm of breast: Secondary | ICD-10-CM

## 2024-06-16 DIAGNOSIS — H401131 Primary open-angle glaucoma, bilateral, mild stage: Secondary | ICD-10-CM | POA: Diagnosis not present

## 2024-08-11 DIAGNOSIS — E785 Hyperlipidemia, unspecified: Secondary | ICD-10-CM | POA: Diagnosis not present

## 2024-08-11 DIAGNOSIS — N1831 Chronic kidney disease, stage 3a: Secondary | ICD-10-CM | POA: Diagnosis not present

## 2024-08-11 DIAGNOSIS — R7309 Other abnormal glucose: Secondary | ICD-10-CM | POA: Diagnosis not present

## 2024-08-11 DIAGNOSIS — R7303 Prediabetes: Secondary | ICD-10-CM | POA: Diagnosis not present

## 2024-08-11 DIAGNOSIS — F419 Anxiety disorder, unspecified: Secondary | ICD-10-CM | POA: Diagnosis not present

## 2024-08-11 DIAGNOSIS — F330 Major depressive disorder, recurrent, mild: Secondary | ICD-10-CM | POA: Diagnosis not present

## 2024-08-11 DIAGNOSIS — I10 Essential (primary) hypertension: Secondary | ICD-10-CM | POA: Diagnosis not present

## 2024-08-11 DIAGNOSIS — M7989 Other specified soft tissue disorders: Secondary | ICD-10-CM | POA: Diagnosis not present

## 2024-08-11 DIAGNOSIS — G609 Hereditary and idiopathic neuropathy, unspecified: Secondary | ICD-10-CM | POA: Diagnosis not present

## 2024-09-24 DIAGNOSIS — M19041 Primary osteoarthritis, right hand: Secondary | ICD-10-CM | POA: Diagnosis not present

## 2024-11-21 ENCOUNTER — Other Ambulatory Visit: Payer: Self-pay

## 2024-11-21 ENCOUNTER — Ambulatory Visit: Admission: RE | Admit: 2024-11-21 | Discharge: 2024-11-21 | Disposition: A | Discharge status: Home / Self Care

## 2024-11-21 ENCOUNTER — Ambulatory Visit (INDEPENDENT_AMBULATORY_CARE_PROVIDER_SITE_OTHER): Admitting: Radiology

## 2024-11-21 VITALS — BP 145/78 | HR 58 | Temp 98.8°F | Resp 19 | Ht 68.0 in | Wt 200.0 lb

## 2024-11-21 DIAGNOSIS — R0602 Shortness of breath: Secondary | ICD-10-CM

## 2024-11-21 DIAGNOSIS — R058 Other specified cough: Secondary | ICD-10-CM | POA: Diagnosis not present

## 2024-11-21 DIAGNOSIS — J209 Acute bronchitis, unspecified: Secondary | ICD-10-CM | POA: Diagnosis not present

## 2024-11-21 MED ORDER — PREDNISONE 20 MG PO TABS: 40.0000 mg | ORAL_TABLET | Freq: Every day | ORAL | 0 refills | Status: AC

## 2024-11-21 MED ORDER — AZITHROMYCIN 250 MG PO TABS: ORAL_TABLET | ORAL | 0 refills | Status: AC

## 2024-11-21 MED ORDER — IPRATROPIUM-ALBUTEROL 0.5-2.5 (3) MG/3ML IN SOLN
3.0000 mL | Freq: Once | RESPIRATORY_TRACT | Status: AC
  Administered 2024-11-21: 3 mL via RESPIRATORY_TRACT

## 2024-11-21 NOTE — Discharge Instructions (Addendum)
 VISIT SUMMARY:  You came in today because of a persistent cough and chest congestion that you have been experiencing for the past three weeks. You mentioned that the symptoms started after your husband had similar issues. You have been using over-the-counter cough medicine, which has helped somewhat, but the cough continues to disturb your sleep. A chest x-ray was performed, and you have a history of bronchitis but no asthma or COPD.  YOUR PLAN:  -ACUTE BRONCHITIS: Acute bronchitis is an inflammation of the bronchial tubes in the lungs, often causing a persistent cough and chest congestion. Your chest x-ray showed no signs of pneumonia, but there was possible wheezing and reduced air movement in the lower lung fields. Today, you received a breathing treatment in the office. You have been prescribed azithromycin  (Z-Pak) to help with bacterial infection and reduce pulmonary inflammation, and steroids to reduce inflammation and improve your breathing. You should continue using your over-the-counter cough medications. Your prescriptions have been sent to the pharmacy.  INSTRUCTIONS:  Please follow up with us  if your symptoms do not improve or if they worsen. Make sure to complete the full course of the prescribed medications. If you experience any new symptoms or have any concerns, contact our office immediately.

## 2024-11-21 NOTE — ED Triage Notes (Addendum)
 Pt presents with a chief complaint of productive cough x 3 weeks. Cough has been accompanied with nasal congestion. OTC cough medicine taken with some improvement at home. Pt is coughing continuously in triage. Denies SOB however sounds SOB when trying to explain her symptoms. RR 19. This was noted after a coughing spell. Spouse has been sick with similar symptoms. Voicing pain in her upper chest/throat area only when coughing.

## 2024-11-21 NOTE — ED Provider Notes (Signed)
 [facility]    CSN: [id number] Arrival date & time: 11/21/24  1018      History   Chief Complaint Chief Complaint  Patient presents with   Cough    Entered by patient    HPI Tara Santana is a 73 y.o. female.  has no past medical history on file.   HPI  Discussed the use of AI scribe software for clinical note transcription with the patient, who gave verbal consent to proceed. The patient presents with persistent cough and chest congestion.  She has been experiencing chest congestion and a persistent cough for the past three weeks, which began in early November after her husband experienced similar symptoms. The cough is described as productive and difficult to stop once it starts.  She has been using over-the-counter cough medicine containing dextromethorphan, which has helped to some extent in breaking up the congestion, but the cough persists. No fever or chills have been reported during this period.  A chest x-ray was performed. She has a history of bronchitis but denies any history of asthma or COPD.  The persistent cough has been impacting her sleep, as she wakes up choking and unable to sleep through the night.  She recently moved to a new home in March after living in her previous home for fifty years, which was a stressful experience.     History reviewed. No pertinent past medical history.  Patient Active Problem List   Diagnosis Date Noted   Overactive bladder 07/20/2020   Overweight with body mass index (BMI) 25.0-29.9 07/20/2020   Hypertensive disorder 07/20/2020   Osteopenia 07/20/2020   History of hysterectomy, supracervical 07/20/2020    History reviewed. No pertinent surgical history.  OB History   No obstetric history on file.      Home Medications    Prior to Admission medications   Medication Sig Start Date End Date Taking? Authorizing Provider  azithromycin (ZITHROMAX) 250 MG tablet Take 500mg  PO daily x1d and then  250mg  daily x4 days 11/21/24  Yes Travion Ke E, PA-C  predniSONE (DELTASONE) 20 MG tablet Take 2 tablets (40 mg total) by mouth daily for 5 days. 11/21/24 11/26/24 Yes Kimesha Claxton E, PA-C  buPROPion (WELLBUTRIN SR) 150 MG 12 hr tablet Take 150 mg by mouth 2 (two) times daily. 06/10/20   [provider]  Cholecalciferol 125 MCG (5000 UT) TABS Vitamin D3 125 mcg (5,000 unit) tablet  Take 1 tablet every day by oral route.    [provider]  DULoxetine (CYMBALTA) 60 MG capsule Take 60 mg by mouth daily.    [provider]  gabapentin (NEURONTIN) 800 MG tablet Take 800 mg by mouth 3 (three) times daily.    [provider]  lisinopril (PRINIVIL,ZESTRIL) 10 MG tablet Take 10 mg by mouth daily.    [provider]  mupirocin  ointment (BACTROBAN ) 2 % Apply to wound every other daya 07/20/20   Silva Juliene SAUNDERS, DPM  rosuvastatin (CRESTOR) 10 MG tablet Take 10 mg by mouth at bedtime.    [provider]    Family History Family History  Problem Relation Age of Onset   Breast cancer Mother 76   BRCA 1/2 Neg Hx     Social History Social History   Tobacco Use   Smoking status: Never   Smokeless tobacco: Never  Vaping Use   Vaping status: Never Used  Substance Use Topics   Alcohol use: Never   Drug use: Never  Allergies   Versed [midazolam], Carbamazepine, Fentanyl, and Morphine and codeine   Review of Systems Review of Systems  Constitutional:  Negative for chills and fever.  Respiratory:  Positive for cough, chest tightness and shortness of breath.      Physical Exam Triage Vital Signs ED Triage Vitals  Encounter Vitals Group     BP 11/21/24 1045 (!) 165/84     Girls Systolic BP Percentile --      Girls Diastolic BP Percentile --      Boys Systolic BP Percentile --      Boys Diastolic BP Percentile --      Pulse Rate 11/21/24 1045 66     Resp 11/21/24 1045 15     Temp 11/21/24 1045 98.8 F (37.1 C)     Temp Source  11/21/24 1045 Oral     SpO2 11/21/24 1045 95 %     Weight 11/21/24 1045 200 lb (90.7 kg)     Height 11/21/24 1045 5' 8 (1.727 m)     Head Circumference --      Peak Flow --      Pain Score 11/21/24 1053 8     Pain Loc --      Pain Education --      Exclude from Growth Chart --    No data found.  Updated Vital Signs BP (!) 145/78 (BP Location: Right Arm)   Pulse (!) 58   Temp 98.8 F (37.1 C) (Oral)   Resp 19   Ht 5' 8 (1.727 m)   Wt 200 lb (90.7 kg)   SpO2 94%   BMI 30.41 kg/m   Visual Acuity Right Eye Distance:   Left Eye Distance:   Bilateral Distance:    Right Eye Near:   Left Eye Near:    Bilateral Near:     Physical Exam Vitals reviewed.  Constitutional:      General: She is awake.     Appearance: Normal appearance. She is well-developed and well-groomed.  HENT:     Head: Normocephalic and atraumatic.     Right Ear: Hearing, tympanic membrane and ear canal normal.     Left Ear: Hearing, tympanic membrane and ear canal normal.     Mouth/Throat:     Lips: Pink.     Mouth: Mucous membranes are moist.     Pharynx: Oropharynx is clear. Uvula midline. No pharyngeal swelling, oropharyngeal exudate, posterior oropharyngeal erythema, uvula swelling or postnasal drip.  Cardiovascular:     Rate and Rhythm: Normal rate and regular rhythm.     Pulses: Normal pulses.          Radial pulses are 2+ on the right side and 2+ on the left side.     Heart sounds: Normal heart sounds. No murmur heard.    No friction rub. No gallop.  Pulmonary:     Effort: Pulmonary effort is normal.     Breath sounds: No decreased air movement. Examination of the right-lower field reveals decreased breath sounds and wheezing. Examination of the left-lower field reveals decreased breath sounds. Decreased breath sounds and wheezing present. No rhonchi or rales.     Comments: Pt has decreased inspiratory sounds particularly in the lower fields bilaterally  Musculoskeletal:     Cervical back:  Normal range of motion and neck supple.  Lymphadenopathy:     Head:     Right side of head: No submental, submandibular or preauricular adenopathy.     Left side of head: No  submental, submandibular or preauricular adenopathy.     Cervical:     Right cervical: No superficial cervical adenopathy.    Left cervical: No superficial cervical adenopathy.     Upper Body:     Right upper body: No supraclavicular adenopathy.     Left upper body: No supraclavicular adenopathy.  Neurological:     Mental Status: She is alert.  Psychiatric:        Behavior: Behavior is cooperative.      UC Treatments / Results  Labs (all labs ordered are listed, but only abnormal results are displayed) Labs Reviewed - No data to display  EKG   Radiology DG Chest 2 View Result Date: 11/21/2024 CLINICAL DATA:  Productive cough for 3 weeks. EXAM: DG CHEST 2V COMPARISON:  None Available. FINDINGS: Cardiac silhouette is normal in size and configuration. No mediastinal or hilar masses. No evidence of adenopathy. Clear lungs.  No pleural effusion or pneumothorax. Skeletal structures are intact. IMPRESSION: No active cardiopulmonary disease. Electronically Signed   By: Alm Parkins M.D.   On: 11/21/2024 11:48    Procedures Procedures (including critical care time)  Medications Ordered in UC Medications  ipratropium-albuterol (DUONEB) 0.5-2.5 (3) MG/3ML nebulizer solution 3 mL (3 mLs Nebulization Given 11/21/24 1203)    Initial Impression / Assessment and Plan / UC Course  I have reviewed the triage vital signs and the nursing notes.  Pertinent labs & imaging results that were available during my care of the patient were reviewed by me and considered in my medical decision making (see chart for details).    Patient has significant improvement in air movement and eradication of wheezing following DuoNeb breathing treatment administered in clinic.  Patient states that she feels like she can breathe much  easier after completing treatment but she is still profusely coughing  Final Clinical Impressions(s) / UC Diagnoses   Final diagnoses:  Productive cough  SOB (shortness of breath)  Acute bronchitis, unspecified organism   Acute bronchitis Persistent cough for almost three weeks with chest congestion and productive cough. No fever or chills. Chest x-ray shows no pneumonia. Possible wheezing and reduced air movement in lower lung fields improved with Duoneb administration. Previous hx of  bronchitis but no asthma or COPD. Current symptoms not improving with over-the-counter medications. - Administered breathing treatment in office - Prescribed azithromycin (Z-Pak) for antibiotic coverage and pulmonary inflammation reduction - Prescribed steroids to reduce inflammation and improve breathing - Continue over-the-counter cough medications - Sent prescriptions to pharmacy ED and return precautions reviewed and provided in AVS.  Follow-up as needed.    Discharge Instructions      VISIT SUMMARY:  You came in today because of a persistent cough and chest congestion that you have been experiencing for the past three weeks. You mentioned that the symptoms started after your husband had similar issues. You have been using over-the-counter cough medicine, which has helped somewhat, but the cough continues to disturb your sleep. A chest x-ray was performed, and you have a history of bronchitis but no asthma or COPD.  YOUR PLAN:  -ACUTE BRONCHITIS: Acute bronchitis is an inflammation of the bronchial tubes in the lungs, often causing a persistent cough and chest congestion. Your chest x-ray showed no signs of pneumonia, but there was possible wheezing and reduced air movement in the lower lung fields. Today, you received a breathing treatment in the office. You have been prescribed azithromycin (Z-Pak) to help with bacterial infection and reduce pulmonary inflammation, and steroids to  reduce  inflammation and improve your breathing. You should continue using your over-the-counter cough medications. Your prescriptions have been sent to the pharmacy.  INSTRUCTIONS:  Please follow up with us  if your symptoms do not improve or if they worsen. Make sure to complete the full course of the prescribed medications. If you experience any new symptoms or have any concerns, contact our office immediately.     ED Prescriptions     Medication Sig Dispense Auth. Provider   azithromycin (ZITHROMAX) 250 MG tablet Take 500mg  PO daily x1d and then 250mg  daily x4 days 6 each Asim Gersten E, PA-C   predniSONE (DELTASONE) 20 MG tablet Take 2 tablets (40 mg total) by mouth daily for 5 days. 10 tablet Keoni Risinger E, PA-C      PDMP not reviewed this encounter.   Marylene Rocky BRAVO, PA-C 11/21/24 [id number]

## 2024-12-31 ENCOUNTER — Encounter (HOSPITAL_BASED_OUTPATIENT_CLINIC_OR_DEPARTMENT_OTHER): Payer: Self-pay | Admitting: Family Medicine

## 2025-01-02 ENCOUNTER — Other Ambulatory Visit (HOSPITAL_BASED_OUTPATIENT_CLINIC_OR_DEPARTMENT_OTHER): Payer: Self-pay | Admitting: Family Medicine

## 2025-01-02 DIAGNOSIS — Z1231 Encounter for screening mammogram for malignant neoplasm of breast: Secondary | ICD-10-CM

## 2025-02-02 ENCOUNTER — Inpatient Hospital Stay (HOSPITAL_BASED_OUTPATIENT_CLINIC_OR_DEPARTMENT_OTHER): Admission: RE | Admit: 2025-02-02 | Source: Ambulatory Visit
